# Patient Record
Sex: Male | Born: 1965 | Race: White | Hispanic: No | Marital: Married | Smoking: Former smoker
Health system: Southern US, Community
[De-identification: ages and names within clinical notes are randomized; demographics above are authoritative.]

## PROBLEM LIST (undated history)

## (undated) DIAGNOSIS — R413 Other amnesia: Secondary | ICD-10-CM

## (undated) DIAGNOSIS — M549 Dorsalgia, unspecified: Secondary | ICD-10-CM

## (undated) DIAGNOSIS — N200 Calculus of kidney: Secondary | ICD-10-CM

## (undated) DIAGNOSIS — I10 Essential (primary) hypertension: Secondary | ICD-10-CM

## (undated) DIAGNOSIS — E785 Hyperlipidemia, unspecified: Secondary | ICD-10-CM

## (undated) HISTORY — DX: Other amnesia: R41.3

## (undated) HISTORY — DX: Essential (primary) hypertension: I10

## (undated) HISTORY — PX: SHOULDER SURGERY: SHX246

## (undated) HISTORY — DX: Dorsalgia, unspecified: M54.9

## (undated) HISTORY — DX: Hyperlipidemia, unspecified: E78.5

## (undated) HISTORY — DX: Calculus of kidney: N20.0

---

## 1996-12-21 HISTORY — PX: OTHER SURGICAL HISTORY: SHX169

## 2004-02-05 ENCOUNTER — Ambulatory Visit (HOSPITAL_BASED_OUTPATIENT_CLINIC_OR_DEPARTMENT_OTHER): Admission: RE | Admit: 2004-02-05 | Discharge: 2004-02-05 | Payer: Self-pay | Admitting: Family Medicine

## 2004-02-05 ENCOUNTER — Encounter (INDEPENDENT_AMBULATORY_CARE_PROVIDER_SITE_OTHER): Payer: Self-pay | Admitting: Internal Medicine

## 2004-07-02 ENCOUNTER — Emergency Department (HOSPITAL_COMMUNITY): Admission: EM | Admit: 2004-07-02 | Discharge: 2004-07-02 | Payer: Self-pay | Admitting: Emergency Medicine

## 2007-12-05 ENCOUNTER — Emergency Department (HOSPITAL_COMMUNITY): Admission: EM | Admit: 2007-12-05 | Discharge: 2007-12-05 | Payer: Self-pay | Admitting: Emergency Medicine

## 2008-01-12 ENCOUNTER — Ambulatory Visit: Payer: Self-pay | Admitting: Family Medicine

## 2008-01-12 ENCOUNTER — Encounter (INDEPENDENT_AMBULATORY_CARE_PROVIDER_SITE_OTHER): Payer: Self-pay | Admitting: Internal Medicine

## 2008-01-12 DIAGNOSIS — K209 Esophagitis, unspecified without bleeding: Secondary | ICD-10-CM | POA: Insufficient documentation

## 2008-01-12 DIAGNOSIS — E669 Obesity, unspecified: Secondary | ICD-10-CM

## 2008-01-12 DIAGNOSIS — R519 Headache, unspecified: Secondary | ICD-10-CM | POA: Insufficient documentation

## 2008-01-12 DIAGNOSIS — I10 Essential (primary) hypertension: Secondary | ICD-10-CM | POA: Insufficient documentation

## 2008-01-12 DIAGNOSIS — R51 Headache: Secondary | ICD-10-CM

## 2008-01-18 ENCOUNTER — Ambulatory Visit: Payer: Self-pay | Admitting: Internal Medicine

## 2008-01-19 ENCOUNTER — Ambulatory Visit: Payer: Self-pay | Admitting: Internal Medicine

## 2008-01-19 ENCOUNTER — Telehealth (INDEPENDENT_AMBULATORY_CARE_PROVIDER_SITE_OTHER): Payer: Self-pay | Admitting: Internal Medicine

## 2008-01-19 DIAGNOSIS — E782 Mixed hyperlipidemia: Secondary | ICD-10-CM

## 2008-01-19 DIAGNOSIS — E1165 Type 2 diabetes mellitus with hyperglycemia: Secondary | ICD-10-CM

## 2008-01-19 LAB — CONVERTED CEMR LAB
ALT: 29 units/L (ref 0–53)
AST: 21 units/L (ref 0–37)
Albumin: 4.3 g/dL (ref 3.5–5.2)
BUN: 13 mg/dL (ref 6–23)
Basophils Relative: 1 % (ref 0.0–1.0)
Bilirubin, Direct: 0.1 mg/dL (ref 0.0–0.3)
Creatinine, Ser: 1.1 mg/dL (ref 0.4–1.5)
Direct LDL: 158.5 mg/dL
Eosinophils Relative: 8.1 % — ABNORMAL HIGH (ref 0.0–5.0)
GFR calc Af Amer: 94 mL/min
HDL: 30.9 mg/dL — ABNORMAL LOW (ref >39.0)
Hemoglobin: 15.3 g/dL (ref 13.0–17.0)
Monocytes Relative: 9.9 % (ref 3.0–11.0)
Neutro Abs: 3.2 10*3/uL (ref 1.4–7.7)
Platelets: 306 10*3/uL (ref 150–400)
RDW: 12.1 % (ref 11.5–14.6)
TSH: 1.56 microintl units/mL (ref 0.35–5.50)
Total CHOL/HDL Ratio: 7.4
Total Protein: 7.1 g/dL (ref 6.0–8.3)
Triglycerides: 226 mg/dL (ref 0–149)

## 2008-01-24 LAB — CONVERTED CEMR LAB: Hgb A1c MFr Bld: 6.8 % — ABNORMAL HIGH (ref 4.6–6.0)

## 2008-01-26 ENCOUNTER — Ambulatory Visit: Payer: Self-pay | Admitting: Family Medicine

## 2008-01-30 ENCOUNTER — Encounter (INDEPENDENT_AMBULATORY_CARE_PROVIDER_SITE_OTHER): Payer: Self-pay | Admitting: Internal Medicine

## 2008-02-17 ENCOUNTER — Encounter: Payer: Self-pay | Admitting: Internal Medicine

## 2008-02-23 ENCOUNTER — Ambulatory Visit: Payer: Self-pay | Admitting: Family Medicine

## 2008-02-27 LAB — CONVERTED CEMR LAB
ALT: 27 units/L (ref 0–53)
AST: 20 units/L (ref 0–37)
HDL: 38.1 mg/dL — ABNORMAL LOW (ref >39.0)
Triglycerides: 272 mg/dL (ref 0–149)

## 2008-02-28 ENCOUNTER — Ambulatory Visit: Payer: Self-pay | Admitting: Family Medicine

## 2008-02-28 DIAGNOSIS — R413 Other amnesia: Secondary | ICD-10-CM | POA: Insufficient documentation

## 2008-02-28 DIAGNOSIS — E119 Type 2 diabetes mellitus without complications: Secondary | ICD-10-CM

## 2008-02-28 DIAGNOSIS — M5126 Other intervertebral disc displacement, lumbar region: Secondary | ICD-10-CM

## 2008-03-02 LAB — CONVERTED CEMR LAB: ALT: 31 units/L (ref 0–53)

## 2008-03-12 ENCOUNTER — Encounter (INDEPENDENT_AMBULATORY_CARE_PROVIDER_SITE_OTHER): Payer: Self-pay | Admitting: Internal Medicine

## 2008-03-21 ENCOUNTER — Ambulatory Visit: Payer: Self-pay | Admitting: Family Medicine

## 2008-03-21 ENCOUNTER — Telehealth: Payer: Self-pay | Admitting: Family Medicine

## 2008-03-23 ENCOUNTER — Ambulatory Visit: Payer: Self-pay | Admitting: Family Medicine

## 2008-03-26 LAB — CONVERTED CEMR LAB
ALT: 29 units/L (ref 0–53)
Alkaline Phosphatase: 51 units/L (ref 39–117)
HDL: 33.2 mg/dL — ABNORMAL LOW (ref >39.0)
Total Bilirubin: 0.8 mg/dL (ref 0.3–1.2)
Total Protein: 7.3 g/dL (ref 6.0–8.3)
Triglycerides: 186 mg/dL — ABNORMAL HIGH (ref 0–149)

## 2008-04-09 ENCOUNTER — Ambulatory Visit: Payer: Self-pay | Admitting: Family Medicine

## 2008-04-09 ENCOUNTER — Encounter (INDEPENDENT_AMBULATORY_CARE_PROVIDER_SITE_OTHER): Payer: Self-pay | Admitting: Internal Medicine

## 2008-04-09 DIAGNOSIS — G4730 Sleep apnea, unspecified: Secondary | ICD-10-CM | POA: Insufficient documentation

## 2008-04-18 ENCOUNTER — Telehealth (INDEPENDENT_AMBULATORY_CARE_PROVIDER_SITE_OTHER): Payer: Self-pay | Admitting: Internal Medicine

## 2008-04-19 ENCOUNTER — Encounter (INDEPENDENT_AMBULATORY_CARE_PROVIDER_SITE_OTHER): Payer: Self-pay | Admitting: Internal Medicine

## 2008-04-23 ENCOUNTER — Ambulatory Visit (HOSPITAL_BASED_OUTPATIENT_CLINIC_OR_DEPARTMENT_OTHER): Admission: RE | Admit: 2008-04-23 | Discharge: 2008-04-23 | Payer: Self-pay | Admitting: Urology

## 2008-07-10 ENCOUNTER — Telehealth (INDEPENDENT_AMBULATORY_CARE_PROVIDER_SITE_OTHER): Payer: Self-pay | Admitting: Internal Medicine

## 2008-07-23 ENCOUNTER — Encounter: Admission: RE | Admit: 2008-07-23 | Discharge: 2008-07-23 | Payer: Self-pay | Admitting: Specialist

## 2008-07-24 ENCOUNTER — Telehealth (INDEPENDENT_AMBULATORY_CARE_PROVIDER_SITE_OTHER): Payer: Self-pay | Admitting: Internal Medicine

## 2008-07-27 ENCOUNTER — Encounter: Admission: RE | Admit: 2008-07-27 | Discharge: 2008-07-27 | Payer: Self-pay | Admitting: Specialist

## 2008-08-10 ENCOUNTER — Encounter (INDEPENDENT_AMBULATORY_CARE_PROVIDER_SITE_OTHER): Payer: Self-pay | Admitting: Internal Medicine

## 2008-08-10 ENCOUNTER — Telehealth (INDEPENDENT_AMBULATORY_CARE_PROVIDER_SITE_OTHER): Payer: Self-pay | Admitting: Internal Medicine

## 2008-08-28 ENCOUNTER — Telehealth (INDEPENDENT_AMBULATORY_CARE_PROVIDER_SITE_OTHER): Payer: Self-pay | Admitting: Internal Medicine

## 2008-09-12 ENCOUNTER — Telehealth (INDEPENDENT_AMBULATORY_CARE_PROVIDER_SITE_OTHER): Payer: Self-pay | Admitting: Internal Medicine

## 2008-09-25 ENCOUNTER — Ambulatory Visit: Payer: Self-pay | Admitting: Family Medicine

## 2008-10-05 ENCOUNTER — Ambulatory Visit: Payer: Self-pay | Admitting: Family Medicine

## 2008-10-09 ENCOUNTER — Telehealth (INDEPENDENT_AMBULATORY_CARE_PROVIDER_SITE_OTHER): Payer: Self-pay | Admitting: Internal Medicine

## 2008-10-09 LAB — CONVERTED CEMR LAB
ALT: 46 units/L (ref 0–53)
AST: 21 units/L (ref 0–37)
HDL: 51.8 mg/dL (ref >39.0)
Hgb A1c MFr Bld: 6.4 % — ABNORMAL HIGH (ref 4.6–6.0)
Triglycerides: 152 mg/dL — ABNORMAL HIGH (ref 0–149)

## 2008-10-12 ENCOUNTER — Telehealth (INDEPENDENT_AMBULATORY_CARE_PROVIDER_SITE_OTHER): Payer: Self-pay | Admitting: Internal Medicine

## 2008-10-24 ENCOUNTER — Ambulatory Visit (HOSPITAL_COMMUNITY): Admission: RE | Admit: 2008-10-24 | Discharge: 2008-10-24 | Payer: Self-pay | Admitting: Specialist

## 2008-11-06 ENCOUNTER — Telehealth (INDEPENDENT_AMBULATORY_CARE_PROVIDER_SITE_OTHER): Payer: Self-pay | Admitting: Internal Medicine

## 2008-11-08 ENCOUNTER — Ambulatory Visit: Payer: Self-pay | Admitting: Family Medicine

## 2008-11-08 DIAGNOSIS — R21 Rash and other nonspecific skin eruption: Secondary | ICD-10-CM | POA: Insufficient documentation

## 2008-11-09 ENCOUNTER — Ambulatory Visit: Payer: Self-pay | Admitting: Family Medicine

## 2008-11-20 ENCOUNTER — Telehealth (INDEPENDENT_AMBULATORY_CARE_PROVIDER_SITE_OTHER): Payer: Self-pay | Admitting: *Deleted

## 2008-11-20 LAB — CONVERTED CEMR LAB
Cholesterol: 189 mg/dL (ref 0–200)
HDL: 44.7 mg/dL (ref >39.0)
VLDL: 43 mg/dL — ABNORMAL HIGH (ref 0–40)

## 2008-12-12 ENCOUNTER — Telehealth (INDEPENDENT_AMBULATORY_CARE_PROVIDER_SITE_OTHER): Payer: Self-pay | Admitting: *Deleted

## 2009-01-04 ENCOUNTER — Encounter (INDEPENDENT_AMBULATORY_CARE_PROVIDER_SITE_OTHER): Payer: Self-pay | Admitting: Internal Medicine

## 2009-01-25 ENCOUNTER — Encounter (INDEPENDENT_AMBULATORY_CARE_PROVIDER_SITE_OTHER): Payer: Self-pay | Admitting: Internal Medicine

## 2009-05-09 ENCOUNTER — Encounter (INDEPENDENT_AMBULATORY_CARE_PROVIDER_SITE_OTHER): Payer: Self-pay | Admitting: *Deleted
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
## 2010-01-28 ENCOUNTER — Telehealth: Payer: Self-pay | Admitting: Internal Medicine

## 2010-05-22 ENCOUNTER — Telehealth: Payer: Self-pay | Admitting: Family Medicine

## 2010-05-22 DIAGNOSIS — T7840XA Allergy, unspecified, initial encounter: Secondary | ICD-10-CM | POA: Insufficient documentation

## 2010-05-26 ENCOUNTER — Telehealth: Payer: Self-pay | Admitting: Family Medicine

## 2010-05-27 ENCOUNTER — Encounter: Payer: Self-pay | Admitting: Family Medicine

## 2010-06-24 ENCOUNTER — Ambulatory Visit: Payer: Self-pay | Admitting: Family Medicine

## 2010-06-25 ENCOUNTER — Telehealth: Payer: Self-pay | Admitting: Family Medicine

## 2010-06-26 ENCOUNTER — Encounter: Payer: Self-pay | Admitting: Family Medicine

## 2010-06-26 LAB — CONVERTED CEMR LAB
ALT: 38 units/L (ref 0–53)
Alkaline Phosphatase: 54 units/L (ref 39–117)
Bilirubin, Direct: 0.1 mg/dL (ref 0.0–0.3)
Chloride: 105 meq/L (ref 96–112)
Cholesterol: 222 mg/dL — ABNORMAL HIGH (ref 0–200)
Creatinine, Ser: 0.8 mg/dL (ref 0.4–1.5)
Direct LDL: 123.7 mg/dL
GFR calc non Af Amer: 109.07 mL/min (ref >60)
Glucose, Bld: 120 mg/dL — ABNORMAL HIGH (ref 70–99)
Total Bilirubin: 0.5 mg/dL (ref 0.3–1.2)
Total Protein: 7.2 g/dL (ref 6.0–8.3)

## 2010-06-30 ENCOUNTER — Encounter: Payer: Self-pay | Admitting: Family Medicine

## 2010-07-25 ENCOUNTER — Ambulatory Visit: Payer: Self-pay | Admitting: Family Medicine

## 2010-07-25 ENCOUNTER — Encounter: Payer: Self-pay | Admitting: Family Medicine

## 2010-07-28 LAB — CONVERTED CEMR LAB
ALT: 39 units/L (ref 0–53)
Alkaline Phosphatase: 57 units/L (ref 39–117)
Cholesterol: 201 mg/dL — ABNORMAL HIGH (ref 0–200)
Total Bilirubin: 0.7 mg/dL (ref 0.3–1.2)
VLDL: 68.6 mg/dL — ABNORMAL HIGH (ref 0.0–40.0)

## 2010-07-29 ENCOUNTER — Telehealth: Payer: Self-pay | Admitting: Family Medicine

## 2010-08-29 ENCOUNTER — Telehealth: Payer: Self-pay | Admitting: Family Medicine

## 2010-10-01 ENCOUNTER — Telehealth: Payer: Self-pay | Admitting: Family Medicine

## 2010-10-06 ENCOUNTER — Telehealth: Payer: Self-pay | Admitting: Family Medicine

## 2010-10-22 ENCOUNTER — Telehealth: Payer: Self-pay | Admitting: Family Medicine

## 2010-11-06 ENCOUNTER — Telehealth: Payer: Self-pay | Admitting: Family Medicine

## 2010-12-17 ENCOUNTER — Telehealth: Payer: Self-pay | Admitting: Family Medicine

## 2011-01-20 NOTE — Progress Notes (Signed)
Summary: regarding prior auth for singulair  Phone Note From Pharmacy   Caller: [facility] Summary of Call: Prior Berkley Harvey is needed on singulair.  I tried to get this approved in june but insurance said that pt would need to try a nasal spray first, which he has not tried.  Insurance will not approve this until that is tried and failed.  Please advise. Letter from insurance is on your desk. Initial call taken by: Lowella Petties CMA,  June 25, 2010 12:00 PM  Follow-up for Phone Call        patient stated that he had tried nasal steroid previously w/o relief.  Also has tried 3 oral agents w/o relief.  I wrote on the form.  Please fax back along with clinic note.  Follow-up by: Crawford Givens MD,  June 25, 2010 1:36 PM  Additional Follow-up for Phone Call Additional follow up Details #1::        Form and OV note faxed.  Lugene Fuquay CMA Jessicia Napolitano Dull)  June 25, 2010 2:41 PM
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
## 2011-01-20 NOTE — Progress Notes (Signed)
Summary: needs refills on meds  Phone Note Refill Request Call back at Home Phone [phone] Message from:  Patient  Refills Requested: Medication #1:  VICODIN 5-500 MG TABS 1 by mouth three times a day as needed for pain with sedation caution  Medication #2:  METHOCARBAMOL 500 MG TABS 1 by mouth three times a day as needed pain/muscle spasm with sedation caution Phoned request from pt, please send to [facility].  Pt was told to call if he needed more meds.  He is asking if he can have a higher dose on vicodin, had taken 7.5 mg's before  Initial call taken by: Lowella Petties CMA,  July 29, 2010 11:28 AM  Follow-up for Phone Call        signed, please have patient notify clinic if pain isn't better controlled.  Follow-up by: Crawford Givens MD,  July 29, 2010 1:54 PM  Additional Follow-up for Phone Call Additional follow up Details #1::        Rx's faxed.  Patient Advised.  Additional Follow-up by: Delilah Shan CMA (AAMA),  July 29, 2010 2:28 PM    New/Updated Medications: LORTAB 7.5-500 MG TABS (HYDROCODONE-ACETAMINOPHEN) 1 by mouth three times a day as needed pain Prescriptions: METHOCARBAMOL 500 MG TABS (METHOCARBAMOL) 1 by mouth three times a day as needed pain/muscle spasm with sedation caution  #90 x 1   Entered and Authorized by:   Crawford Givens MD   Signed by:   Crawford Givens MD on 07/29/2010   Method used:   Printed then faxed to ...       [facility] (retail)       [address]       [place], [place]  [address]       Ph: [phone]       Fax: [phone]   RxID:   [id number] LORTAB 7.5-500 MG TABS (HYDROCODONE-ACETAMINOPHEN) 1 by mouth three times a day as needed pain  #90 x 1   Entered and Authorized by:   Crawford Givens MD   Signed by:   Crawford Givens MD on 07/29/2010   Method used:   Printed then faxed to ...       [facility] (retail)       [address]       [place], [place]  [address]       Ph: [phone]       Fax: [phone]   RxID:   [id number]
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
## 2011-01-20 NOTE — Progress Notes (Signed)
Summary: prior auth needed for singulair  Phone Note From Pharmacy   Caller: [facility]  [phone]/ [facility] Summary of Call: Prior Berkley Harvey is needed for singulair, form is on your shelf- [name]'s pt, he has appt with Dr. Para March in july. Initial call taken by: Lowella Petties CMA,  May 22, 2010 9:25 AM  Follow-up for Phone Call        signed. Follow-up by: Shaune Leeks MD,  May 22, 2010 1:45 PM  Additional Follow-up for Phone Call Additional follow up Details #1::        Form faxed. Additional Follow-up by: Lowella Petties CMA,  May 22, 2010 2:05 PM     Appended Document: prior auth needed for singulair    Clinical Lists Changes  Problems: Added new problem of ALLERGY, ENVIRONMENTAL (ICD-995.3)

## 2011-01-20 NOTE — Letter (Signed)
Summary: Mini-Mental State Examination  Mini-Mental State Examination   Imported By: Maryln Gottron 08/01/2010 12:55:32  _____________________________________________________________________  External Attachment:    Type:   Image     Comment:   External Document

## 2011-01-20 NOTE — Medication Information (Signed)
Summary: Prior Authorization & Approval for Singulair/[facility]  Prior Authorization & Approval for Singulair/[facility]   Imported By: Lanelle Bal 07/08/2010 10:57:12  _____________________________________________________________________  External Attachment:    Type:   Image     Comment:   External Document
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
## 2011-01-20 NOTE — Miscellaneous (Signed)
  Clinical Lists Changes  Medications: Added new medication of SIMVASTATIN 20 MG TABS (SIMVASTATIN) Take 1 tab by mouth at bedtime - Signed Rx of SIMVASTATIN 20 MG TABS (SIMVASTATIN) Take 1 tab by mouth at bedtime;  #90 x 3;  Signed;  Entered by: Delilah Shan CMA (AAMA);  Authorized by: Crawford Givens MD;  Method used: Electronically to [facility] [address], [place], [place], [place]  [address], Ph: [phone], Fax: [phone]    Prescriptions: SIMVASTATIN 20 MG TABS (SIMVASTATIN) Take 1 tab by mouth at bedtime  #90 x 3   Entered by:   Delilah Shan CMA (AAMA)   Authorized by:   Crawford Givens MD   Signed by:   Delilah Shan CMA (AAMA) on 06/26/2010   Method used:   Electronically to        [facility] (retail)       [address]       [place], [place]  [address]       Ph: [phone]       Fax: [phone]   RxID:   [id number]
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
## 2011-01-20 NOTE — Progress Notes (Signed)
Summary: refill request for lortab  Phone Note Refill Request Message from:  Fax from Pharmacy  Refills Requested: Medication #1:  LORTAB 7.5-500 MG TABS 1 by mouth three times a day as needed pain.   Last Refilled: 09/20/2010 [facility] [phone]  Initial call taken by: Lowella Petties CMA, AAMA,  October 22, 2010 4:08 PM  Follow-up for Phone Call        please call in.  thanks.  Follow-up by: Crawford Givens MD,  October 22, 2010 4:54 PM  Additional Follow-up for Phone Call Additional follow up Details #1::        Rx called to pharmacy Additional Follow-up by: Sydell Axon LPN,  October 22, 2010 5:00 PM    Prescriptions: LORTAB 7.5-500 MG TABS (HYDROCODONE-ACETAMINOPHEN) 1 by mouth three times a day as needed pain  #90 x 1   Entered and Authorized by:   Crawford Givens MD   Signed by:   Crawford Givens MD on 10/22/2010   Method used:   Telephoned to ...       [facility] (retail)       [address]       [place], [place]  [address]       Ph: [phone]       Fax: [phone]   RxID:   [id number]
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
## 2011-01-20 NOTE — Progress Notes (Signed)
Summary: ok to take tylenol PM?  Phone Note Call from Patient Call back at Home Phone [phone]   Caller: Patient Summary of Call: Pt has back, hip, neck and shoulder pain and is having to take 2 tylenol pm every night. He has been taking this for quite a while- about a year and a half.   He is asking if he should try taking something else, or is it ok for him to continue with the tylenol pm, since it does help.  He takes this along with his muscle relaxer and lortab. Initial call taken by: Lowella Petties CMA,  October 06, 2010 12:14 PM  Follow-up for Phone Call        It is okay to take as long as he doesn't take more than 4000mg  of tylenol in a day.  he needs to add up the total dose for all of the meds.  If it's more than 4000 mg consistently, then we need to decrease his total tylenol use.  (He can take 6 vicodin a day and still take 2 tylenol PMs) Follow-up by: Crawford Givens MD,  October 06, 2010 1:17 PM  Additional Follow-up for Phone Call Additional follow up Details #1::        Left message on machine for patient to call back. Sydell Axon LPN  October 06, 2010 2:14 PM  Patient notified as instructed by telephone. Additional Follow-up by: Sydell Axon LPN,  October 06, 2010 3:52 PM

## 2011-01-20 NOTE — Progress Notes (Signed)
Summary: needs DOT forms completed  Phone Note Call from Patient Call back at Home Phone [phone]   Caller: Patient Call For: Crawford Givens MD Summary of Call: Pt needs DOT forms completed, he had a physical in july but will still need  hearing, vision and urine checked.  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  Please advise. Initial call taken by: Lowella Petties CMA, AAMA,  November 06, 2010 11:01 AM  Follow-up for Phone Call        please get him a 30 min appointment so I can do the work for the form.  With his previous healthy problems, I would prefer that.  thanks.  Follow-up by: Crawford Givens MD,  November 06, 2010 1:09 PM  Additional Follow-up for Phone Call Additional follow up Details #1::        Patient Advised.   He says he will call back in to schedule appointment when he can look at his calendar.  I advised him to be sure to ask for a 30 minute appointment and to bring his DOT paperwork with him.  Lugene Fuquay CMA Duncan Dull)  November 06, 2010 2:25 PM

## 2011-01-22 NOTE — Progress Notes (Signed)
Summary: refill requests for lortab, robaxin  Phone Note Refill Request Message from:  Pharmacy  Refills Requested: Medication #1:  LORTAB 7.5-500 MG TABS 1 by mouth three times a day as needed pain.   Last Refilled: 11/22/2010  Medication #2:  METHOCARBAMOL 500 MG TABS 1 by mouth three times a day as needed pain/muscle spasm with sedation caution   Last Refilled: 11/22/2010 [facility], [phone], ask for [employer].  Initial call taken by: Lowella Petties CMA, AAMA,  December 17, 2010 12:23 PM  Follow-up for Phone Call        please call in.   sedation caution on both meds.   Follow-up by: Crawford Givens MD,  December 17, 2010 12:56 PM  Additional Follow-up for Phone Call Additional follow up Details #1::        [facility]. Additional Follow-up by: Lowella Petties CMA, AAMA,  December 17, 2010 2:54 PM    Prescriptions: LORTAB 7.5-500 MG TABS (HYDROCODONE-ACETAMINOPHEN) 1 by mouth three times a day as needed pain  #90 x 1   Entered and Authorized by:   Crawford Givens MD   Signed by:   Crawford Givens MD on 12/17/2010   Method used:   Telephoned to ...       [facility] (retail)       [address]       [place], [place]  [address]       Ph: [phone]       Fax: [phone]   RxID:   [id number] METHOCARBAMOL 500 MG TABS (METHOCARBAMOL) 1 by mouth three times a day as needed pain/muscle spasm with sedation caution  #90 x 1   Entered and Authorized by:   Crawford Givens MD   Signed by:   Crawford Givens MD on 12/17/2010   Method used:   Telephoned to ...       [facility] (retail)       [address]       [place], [place]  [address]       Ph: [phone]       Fax: [phone]   RxID:   [id number]

## 2011-01-22 NOTE — Progress Notes (Signed)
Summary: copy of med list given to pt  Phone Note Call from Patient   Caller: Patient Summary of Call: [place] and  requests copy of medication list, copy given to him.              Lowella Petties CMA, AAMA  December 17, 2010 12:05 PM   Follow-up for Phone Call        noted. Crawford Givens MD  December 17, 2010 12:05 PM

## 2011-01-28 ENCOUNTER — Encounter (INDEPENDENT_AMBULATORY_CARE_PROVIDER_SITE_OTHER): Payer: Self-pay | Admitting: *Deleted

## 2011-01-28 ENCOUNTER — Other Ambulatory Visit: Payer: Self-pay | Admitting: Family Medicine

## 2011-01-28 ENCOUNTER — Other Ambulatory Visit (INDEPENDENT_AMBULATORY_CARE_PROVIDER_SITE_OTHER): Payer: PRIVATE HEALTH INSURANCE

## 2011-01-28 DIAGNOSIS — E782 Mixed hyperlipidemia: Secondary | ICD-10-CM

## 2011-01-28 DIAGNOSIS — E1165 Type 2 diabetes mellitus with hyperglycemia: Secondary | ICD-10-CM

## 2011-01-28 DIAGNOSIS — E785 Hyperlipidemia, unspecified: Secondary | ICD-10-CM

## 2011-01-28 LAB — LIPID PANEL
Cholesterol: 199 mg/dL (ref 0–200)
HDL: 38.2 mg/dL — ABNORMAL LOW (ref >39.00)
Total CHOL/HDL Ratio: 5
Triglycerides: 401 mg/dL — ABNORMAL HIGH (ref 0.0–149.0)
VLDL: 80.2 mg/dL — ABNORMAL HIGH (ref 0.0–40.0)

## 2011-01-30 ENCOUNTER — Encounter: Payer: Self-pay | Admitting: Family Medicine

## 2011-01-30 ENCOUNTER — Ambulatory Visit (INDEPENDENT_AMBULATORY_CARE_PROVIDER_SITE_OTHER): Payer: PRIVATE HEALTH INSURANCE | Admitting: Family Medicine

## 2011-01-30 DIAGNOSIS — E782 Mixed hyperlipidemia: Secondary | ICD-10-CM

## 2011-01-30 DIAGNOSIS — R413 Other amnesia: Secondary | ICD-10-CM

## 2011-01-30 DIAGNOSIS — M5126 Other intervertebral disc displacement, lumbar region: Secondary | ICD-10-CM
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
## 2011-02-13 ENCOUNTER — Telehealth: Payer: Self-pay | Admitting: Family Medicine

## 2011-02-17 NOTE — Progress Notes (Signed)
Summary: Methocarbamol and Lortab  Phone Note Refill Request   Refills Requested: Medication #1:  METHOCARBAMOL 500 MG TABS 1 by mouth three times a day as needed pain/muscle spasm with sedation caution  Medication #2:  LORTAB 7.5-500 MG TABS 1 by mouth three times a day as needed pain Kenard Gower at [facility]   Method Requested: Telephone to Pharmacy Initial call taken by: Delilah Shan CMA Duncan Dull),  February 13, 2011 11:49 AM  Follow-up for Phone Call        please call in.  Follow-up by: Crawford Givens MD,  February 13, 2011 1:54 PM  Additional Follow-up for Phone Call Additional follow up Details #1::        Medication phoned to pharmacy.  Additional Follow-up by: Delilah Shan CMA (AAMA),  February 13, 2011 2:14 PM    Prescriptions: LORTAB 7.5-500 MG TABS (HYDROCODONE-ACETAMINOPHEN) 1 by mouth three times a day as needed pain  #90 x 1   Entered and Authorized by:   Crawford Givens MD   Signed by:   Crawford Givens MD on 02/13/2011   Method used:   Telephoned to ...       [facility] (retail)       [address]       [place], [place]  [address]       Ph: [phone]       Fax: [phone]   RxID:   [id number] METHOCARBAMOL 500 MG TABS (METHOCARBAMOL) 1 by mouth three times a day as needed pain/muscle spasm with sedation caution  #90 x 1   Entered and Authorized by:   Crawford Givens MD   Signed by:   Crawford Givens MD on 02/13/2011   Method used:   Telephoned to ...       [facility] (retail)       [address]       [place], [place]  [address]       Ph: [phone]       Fax: [phone]   RxID:   [id number]

## 2011-03-30 ENCOUNTER — Other Ambulatory Visit: Payer: Self-pay | Admitting: *Deleted

## 2011-03-30 MED ORDER — GABAPENTIN 300 MG PO CAPS: 300.0000 mg | ORAL_CAPSULE | Freq: Three times a day (TID) | ORAL | Status: DC

## 2011-04-09 ENCOUNTER — Other Ambulatory Visit: Payer: Self-pay | Admitting: *Deleted

## 2011-04-09 MED ORDER — HYDROCODONE-ACETAMINOPHEN 7.5-500 MG PO TABS: ORAL_TABLET | ORAL | Status: DC

## 2011-04-09 NOTE — Telephone Encounter (Signed)
Please call in

## 2011-04-10 ENCOUNTER — Other Ambulatory Visit: Payer: Self-pay | Admitting: *Deleted

## 2011-04-10 MED ORDER — METHOCARBAMOL 500 MG PO TABS: 500.0000 mg | ORAL_TABLET | Freq: Three times a day (TID) | ORAL | Status: AC | PRN

## 2011-04-10 NOTE — Telephone Encounter (Signed)
Medication phoned to pharmacy.  

## 2011-04-10 NOTE — Telephone Encounter (Signed)
Handled on another note.

## 2011-04-21 ENCOUNTER — Telehealth: Payer: Self-pay | Admitting: *Deleted

## 2011-04-21 DIAGNOSIS — G4730 Sleep apnea, unspecified: Secondary | ICD-10-CM

## 2011-04-21 NOTE — Telephone Encounter (Signed)
Referral done. Please notify patient.

## 2011-04-21 NOTE — Telephone Encounter (Signed)
Patient is asking if he can get referral for sleep study. He says that his cpap is not working for him. Please advise.

## 2011-04-21 NOTE — Telephone Encounter (Signed)
Patient advised.

## 2011-05-05 NOTE — Op Note (Signed)
Patrick Ochoa                ACCOUNT NO.:  [id number]   MEDICAL RECORD NO.:  [id number]          PATIENT TYPE:  AMB   LOCATION:  DAY                          FACILITY:  [facility]   PHYSICIAN:  Jene Every, M.D.    DATE OF BIRTH:  1966/03/08   DATE OF PROCEDURE:  10/24/2008  DATE OF DISCHARGE:                               OPERATIVE REPORT   PREOPERATIVE DIAGNOSIS:  Impingement syndrome left shoulder.   POSTOPERATIVE DIAGNOSIS:  Impingement syndrome left shoulder, glenoid  labral tear.   PROCEDURE PERFORMED:  1. Left shoulder arthroscopy.  2. Debridement of the anterior labral tear.  3. Subacromial decompression bursectomy.  4. Debridement of rotator cuff.   SURGEON:  Jene Every, M.D.   ASSISTANT:  Roma Schanz, P.A.   BRIEF HISTORY AND INDICATIONS:  This is a 45 year old with refractory  shoulder pain and MRI indicating impingement tendinitis was indicated  for a decompression, bursectomy, partial relief from cervical cortical  steroid injections.  Risks and benefits were discussed including  bleeding, infection, no change in symptoms, worsening symptoms, need for  repeat debridement, DVT, PE and anesthetic complications, etc.   TECHNIQUE:  With the patient in the supine beach-chair position after  induction of adequate general anesthesia and 2 grams Kefzol, left  shoulder and upper extremity was prepped and draped in the usual sterile  fashion.  Exam under anesthesia revealed full range of motion.   Surgical marker was utilized to delineate the acromion, coracoid and the  [facility] joint.  Standard posterolateral portal was utilized with an incision  through the skin only and anterolateral portal was incised as well  through the skin only and an anterior portal as well.  With the arm in  the 70/30 position we advanced the arthroscopic cannula into the  glenohumeral space penetrating the capsule atraumatically.  We were in  line with the coracoid.  We placed an  anterior cannula, penetrating the  capsule beneath the biceps under direct visualization.   Noted was anterior tearing of the labrum.  The shaver was introduced and  I utilized the shaver and debrided the anterior labrum to a stable base.  There was no evidence of rotator cuff tear.  Subscap was unremarkable.  Biceps tendon was unremarkable.  The glenoid and the humeral head was  unremarkable as well.  After the labral tear was debrided to a stable  base we redirected the arthroscopic camera in the subacromial space and  a lateral portal with the cannula triangulating noting exuberant  hypertrophic bursa/synovitis and shaver was introduced and utilized to  perform a bursectomy/synovectomy.  There was small fraying of the  rotator cuff.  This was shaved.  There was very minimal at less than 5%  of the tendon.  Good bleeding tissue was noted.   We used the ArthroWand and released and morselized the CA ligament.  Good space in the subacromial space was provided.  There was no  significant impingement from the [facility] joint with the acromial anatomy at  this point in full.  Inspection of the rotator cuff revealed no evidence  of a tear.  It was probed and evaluated.  I therefore removed all  arthroscopic equipment and portals were closed with 4-0 simple suture,  0.25% Marcaine with epinephrine was infiltrated in the joint.  The wound  was dressed sterilely and awakened without difficulty and transported to  recovery room in satisfactory condition.  The patient tolerated the  procedure with no complications.      Jene Every, M.D.  Electronically Signed     JB/MEDQ  D:  10/24/2008  T:  10/24/2008  Job:  [id number]

## 2011-05-05 NOTE — Op Note (Signed)
NAMEFERD, HORRIGAN                ACCOUNT NO.:  [id number]   MEDICAL RECORD NO.:  [id number]          PATIENT TYPE:  AMB   LOCATION:  [facility]                         FACILITY:  [facility]   PHYSICIAN:  Mark C. Vernie Ammons, M.D.  DATE OF BIRTH:  May 16, 1966   DATE OF PROCEDURE:  DATE OF DISCHARGE:                               OPERATIVE REPORT   PREOPERATIVE DIAGNOSIS:  Desires sterility.   POSTOPERATIVE DIAGNOSIS:  Desires sterility.   PROCEDURE:  Bilateral vasectomy.   SURGEON:  Mark C. Vernie Ammons, M.D.   ANESTHESIA:  General, with local supplement.   SPECIMENS:  None.   DRAINS:  None.   BLOOD LOSS:  Less than 1 mL.   COMPLICATIONS:  None.   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  An attempt at vasectomy was made as an  outpatient but was unsuccessful due to the patient's anatomy.  When I  examined his testicle, I agreed that he would be best served with a  vasectomy under general anesthetic, and the risks, complications,  alternatives, and limitations were discussed with the patient, and he  understands elected to proceed.   DESCRIPTION OF OPERATION:  After informed consent, the patient was taken  to the OR, was placed on the table, and administered general anesthesia.  His genitalia was sterilely prepped and draped and an official time-out  was then performed.  Using the modified vasectomy piercing hemostat, I  pierced the skin in the midline of the scrotum and was able to palpate  his vas on the right-hand side above his elevated right testicle.  The  vas clamp was then passed through the incision and the vas was easily  grasped and brought to the level of the incision.  I used the modified  sharpened hemostat in order to clear off perivasal tissue and exposed  the vas itself.  I then exposed a length of approximately 2 cm and tied  the vas proximally  with 2-0 silk suture.  Two proximal sutures were  applied and one distal suture and then the vas was divided between  these.  I used Bovie electrocautery to cauterize the loop, the very  luminal openings, and these ends were then allowed to drop back into the  right hemiscrotum.  The left side was then addressed and treated in  identical fashion.  I checked for bleeding and none was noted.  I then  used 0.5% plain Marcaine to infiltrate the area of the incision and  Neosporin, as well fluff and 4x4s were applied as a sterile dressing as  well as a scrotal support, and the patient was taken to recovery in  stable satisfactory condition.  He tolerated procedure well.  There were  no intraoperative complications.  He received 1 gram of Ancef preop and  will be given  a prescription for 16 Vicodin postoperatively.  I then have given him  postoperative instructions, including the fact that he is not consider  himself sterile until two  negative semen analyses have been seen by me  in the office.  Until then, he is to use his current contraceptive  methods.      Mark C. Vernie Ammons, M.D.  Electronically Signed     MCO/MEDQ  D:  04/23/2008  T:  04/23/2008  Job:  [id number]

## 2011-06-02 ENCOUNTER — Other Ambulatory Visit: Payer: Self-pay | Admitting: *Deleted

## 2011-06-03 ENCOUNTER — Telehealth: Payer: Self-pay | Admitting: *Deleted

## 2011-06-03 MED ORDER — HYDROCODONE-ACETAMINOPHEN 7.5-500 MG PO TABS: ORAL_TABLET | ORAL | Status: DC

## 2011-06-03 MED ORDER — METHOCARBAMOL 500 MG PO TABS: 500.0000 mg | ORAL_TABLET | Freq: Three times a day (TID) | ORAL | Status: DC

## 2011-06-03 NOTE — Telephone Encounter (Signed)
See rx note 

## 2011-06-03 NOTE — Telephone Encounter (Signed)
Hydrocodone called to pharmacy  

## 2011-06-03 NOTE — Telephone Encounter (Signed)
Rx refill request for Methocarbamol and Hydrocodone sent to Dr. Para March yesterday for approval.  Waiting on his response.

## 2011-06-03 NOTE — Telephone Encounter (Signed)
Please call in hydrocodone rx and notify pt.  Thanks.

## 2011-06-29 ENCOUNTER — Other Ambulatory Visit: Payer: Self-pay | Admitting: *Deleted

## 2011-06-29 MED ORDER — SIMVASTATIN 20 MG PO TABS: 20.0000 mg | ORAL_TABLET | Freq: Every day | ORAL | Status: DC

## 2011-06-29 MED ORDER — GABAPENTIN 300 MG PO CAPS: 300.0000 mg | ORAL_CAPSULE | Freq: Three times a day (TID) | ORAL | Status: DC

## 2011-06-29 MED ORDER — LISINOPRIL 10 MG PO TABS: 10.0000 mg | ORAL_TABLET | Freq: Every day | ORAL | Status: DC

## 2011-06-29 MED ORDER — METHOCARBAMOL 500 MG PO TABS: 500.0000 mg | ORAL_TABLET | Freq: Three times a day (TID) | ORAL | Status: DC

## 2011-06-29 NOTE — Telephone Encounter (Signed)
rx sent

## 2011-06-29 NOTE — Telephone Encounter (Signed)
Okay to refill Robaxin? Appointment scheduled for August.

## 2011-07-27 ENCOUNTER — Other Ambulatory Visit: Payer: Self-pay | Admitting: *Deleted

## 2011-07-27 ENCOUNTER — Other Ambulatory Visit (INDEPENDENT_AMBULATORY_CARE_PROVIDER_SITE_OTHER): Payer: PRIVATE HEALTH INSURANCE | Admitting: Family Medicine

## 2011-07-27 DIAGNOSIS — E7800 Pure hypercholesterolemia, unspecified: Secondary | ICD-10-CM

## 2011-07-27 DIAGNOSIS — I10 Essential (primary) hypertension: Secondary | ICD-10-CM

## 2011-07-27 DIAGNOSIS — E119 Type 2 diabetes mellitus without complications: Secondary | ICD-10-CM

## 2011-07-27 LAB — LIPID PANEL
Cholesterol: 201 mg/dL — ABNORMAL HIGH (ref 0–200)
Total CHOL/HDL Ratio: 5
Triglycerides: 418 mg/dL — ABNORMAL HIGH (ref 0.0–149.0)
VLDL: 83.6 mg/dL — ABNORMAL HIGH (ref 0.0–40.0)

## 2011-07-27 LAB — COMPREHENSIVE METABOLIC PANEL
ALT: 32 U/L (ref 0–53)
AST: 22 U/L (ref 0–37)
Albumin: 4.9 g/dL (ref 3.5–5.2)
Alkaline Phosphatase: 57 U/L (ref 39–117)
BUN: 18 mg/dL (ref 6–23)
Calcium: 9.5 mg/dL (ref 8.4–10.5)
Chloride: 99 mEq/L (ref 96–112)
Potassium: 4.3 mEq/L (ref 3.5–5.1)

## 2011-07-27 LAB — LDL CHOLESTEROL, DIRECT: Direct LDL: 100.3 mg/dL

## 2011-07-27 MED ORDER — HYDROCODONE-ACETAMINOPHEN 7.5-500 MG PO TABS: ORAL_TABLET | ORAL | Status: DC

## 2011-07-27 NOTE — Telephone Encounter (Signed)
Please call in

## 2011-07-27 NOTE — Telephone Encounter (Signed)
Rx called to pharmacy

## 2011-07-30 ENCOUNTER — Encounter: Payer: Self-pay | Admitting: Family Medicine

## 2011-07-30 ENCOUNTER — Other Ambulatory Visit: Payer: Self-pay | Admitting: *Deleted

## 2011-07-30 MED ORDER — MONTELUKAST SODIUM 10 MG PO TABS: 10.0000 mg | ORAL_TABLET | Freq: Every day | ORAL | Status: DC

## 2011-07-30 NOTE — Telephone Encounter (Signed)
Received faxed refill request, medication was not on med list please advise.

## 2011-07-30 NOTE — Telephone Encounter (Signed)
He had been on this prev.  Will continue.  Rx sent.

## 2011-07-31 ENCOUNTER — Encounter: Payer: Self-pay | Admitting: Family Medicine

## 2011-07-31 ENCOUNTER — Ambulatory Visit (INDEPENDENT_AMBULATORY_CARE_PROVIDER_SITE_OTHER): Payer: PRIVATE HEALTH INSURANCE | Admitting: Family Medicine

## 2011-07-31 VITALS — BP 132/90 | HR 75 | Temp 98.7°F | Wt 250.2 lb

## 2011-07-31 DIAGNOSIS — M549 Dorsalgia, unspecified: Secondary | ICD-10-CM

## 2011-07-31 DIAGNOSIS — R413 Other amnesia: Secondary | ICD-10-CM

## 2011-07-31 DIAGNOSIS — E119 Type 2 diabetes mellitus without complications: Secondary | ICD-10-CM

## 2011-07-31 DIAGNOSIS — R51 Headache: Secondary | ICD-10-CM

## 2011-07-31 DIAGNOSIS — G8929 Other chronic pain: Secondary | ICD-10-CM

## 2011-07-31 DIAGNOSIS — E782 Mixed hyperlipidemia: Secondary | ICD-10-CM

## 2011-07-31 DIAGNOSIS — J309 Allergic rhinitis, unspecified: Secondary | ICD-10-CM

## 2011-07-31 MED ORDER — GABAPENTIN 300 MG PO CAPS: 300.0000 mg | ORAL_CAPSULE | Freq: Three times a day (TID) | ORAL | Status: DC

## 2011-07-31 NOTE — Patient Instructions (Addendum)
Please talk to Patrick Ochoa about getting medication assistance on either treximet or sumatriptan.   Recheck labs in 6 months with a OV a few days after that. Gradually increase up to 6 pills a day on the neurontin.  Call me with an update early next week.
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
## 2011-08-03 DIAGNOSIS — M549 Dorsalgia, unspecified: Secondary | ICD-10-CM | POA: Insufficient documentation

## 2011-08-03 DIAGNOSIS — J309 Allergic rhinitis, unspecified: Secondary | ICD-10-CM | POA: Insufficient documentation

## 2011-08-03 NOTE — Assessment & Plan Note (Signed)
Will work on drug assistance program.

## 2011-08-03 NOTE — Assessment & Plan Note (Signed)
He can't exercise much with his back pain.  Will work on diet and we'll try to address back pain.

## 2011-08-03 NOTE — Assessment & Plan Note (Signed)
Continue current meds 

## 2011-08-03 NOTE — Assessment & Plan Note (Signed)
Continue current meds, work on diet.

## 2011-08-03 NOTE — Assessment & Plan Note (Signed)
Continue current meds but increase the gabapentin.  He'll call back with update.  No new focal changes in exam today.  He agrees with plan.

## 2011-08-06 ENCOUNTER — Telehealth: Payer: Self-pay | Admitting: *Deleted

## 2011-08-06 NOTE — Telephone Encounter (Signed)
[facility] uses [facility].

## 2011-08-06 NOTE — Telephone Encounter (Signed)
Patient called to let you know that that the Gabapentin isn't helping. She says that he feels like it isn't quite enough. Still having a lot of pain. Really hard for him to move around and do things. He is asking if it can be increased or either try something else.

## 2011-08-09 NOTE — Telephone Encounter (Signed)
Call pt and increase the gabapentin up to 900mg  at a time, 3 times a day.  Please update med list and have pt call back with update.  Thanks.

## 2011-08-10 NOTE — Telephone Encounter (Signed)
Patient advised.

## 2011-08-20 ENCOUNTER — Other Ambulatory Visit: Payer: Self-pay | Admitting: *Deleted

## 2011-08-20 MED ORDER — HYDROCODONE-ACETAMINOPHEN 7.5-500 MG PO TABS: ORAL_TABLET | ORAL | Status: DC

## 2011-08-20 NOTE — Telephone Encounter (Signed)
Rx. Phoned to pharmacy 

## 2011-08-20 NOTE — Telephone Encounter (Signed)
Please call in.  Thanks.   

## 2011-08-21 ENCOUNTER — Telehealth: Payer: Self-pay | Admitting: *Deleted

## 2011-08-21 NOTE — Telephone Encounter (Signed)
Left message on voicemail at home number.

## 2011-08-21 NOTE — Telephone Encounter (Signed)
He's likely going to be sore for days.  If SOB, passing blood, or in severe pain, then he needs to be seen.  O/w it would be okay to talk regular pain meds and try to rest at home.

## 2011-08-21 NOTE — Telephone Encounter (Signed)
Patient called to let Dr. Para March know that he almost choked on a zip tie that was in his sandwich that he got from [facility] on Monday.  He was home alone when this happened and he was able to throw himself over the couch to help get it dislodged.  He stated that now he has pain under his ribs.  He is on pain medication for back pain and has been taking it.  He wanted to know if he should be seen.  Patient stated that he has no other pain, no problems breathing, swallowing, or eating.  Please advise.

## 2011-09-17 ENCOUNTER — Telehealth: Payer: Self-pay

## 2011-09-17 MED ORDER — HYDROCODONE-ACETAMINOPHEN 10-500 MG PO TABS: 1.0000 | ORAL_TABLET | Freq: Three times a day (TID) | ORAL | Status: AC

## 2011-09-17 NOTE — Telephone Encounter (Signed)
Please call in the change in the hydrocodone dose.  I want pt to f/u with me for 30 min visit in the next month (after he's had a chance to see how he's doing on the higher dose of meds). Thanks.

## 2011-09-17 NOTE — Telephone Encounter (Signed)
Left f/m for pt to call back. Medication phoned to [facility] as instructed.

## 2011-09-17 NOTE — Telephone Encounter (Signed)
Pt is to get pain med refilled this weekend and for 2 months pt's pain is not controlled with present meds. Pt has lower back pain that radiates to left hip and also neck pain. Pt said after takes med 1-2 hours later pain is coming back. Pt is using heat and tens unit also. Pt is taking Hydrocodone/APAP 7.5/500 mg one tab at 7am, 12 noon and 6 pm. Pt takes Gabapentin 300 mg tablet taking 900 mg tid and that has helped the nerve pain in leg. Pt also takes Methocarbamol 500 mg 6am, 12 noon and 6 pm. Pt would like med increased before he refills again. Pt uses [facility] and can be contacted [phone]. Pt understands Dr Para March will be in office this afternoon.Please advise.

## 2011-09-18 NOTE — Telephone Encounter (Signed)
Patient advised as instructed via telephone.  He will call back to schedule f/u appt.

## 2011-09-22 LAB — BASIC METABOLIC PANEL
BUN: 16
Chloride: 102
Potassium: 4.2
Sodium: 136

## 2011-09-22 LAB — CBC
HCT: 42.1
Hemoglobin: 14.2
MCV: 95.5
Platelets: 240
RBC: 4.41
WBC: 5.7

## 2011-09-24 ENCOUNTER — Other Ambulatory Visit: Payer: Self-pay | Admitting: *Deleted

## 2011-09-24 MED ORDER — GABAPENTIN 300 MG PO CAPS: 900.0000 mg | ORAL_CAPSULE | Freq: Three times a day (TID) | ORAL | Status: DC

## 2011-09-24 NOTE — Telephone Encounter (Signed)
Pharmacy needs new script, their hard copy reads one to two three times a day.  Please call in to [facility].

## 2011-09-25 NOTE — Telephone Encounter (Signed)
Sent!

## 2011-10-15 ENCOUNTER — Other Ambulatory Visit: Payer: Self-pay | Admitting: *Deleted

## 2011-10-15 MED ORDER — DONEPEZIL HCL 5 MG PO TABS: 5.0000 mg | ORAL_TABLET | Freq: Every day | ORAL | Status: DC

## 2011-10-15 MED ORDER — METHOCARBAMOL 500 MG PO TABS: 500.0000 mg | ORAL_TABLET | Freq: Three times a day (TID) | ORAL | Status: DC

## 2011-10-15 MED ORDER — HYDROCODONE-ACETAMINOPHEN 10-500 MG PO TABS: 1.0000 | ORAL_TABLET | Freq: Three times a day (TID) | ORAL | Status: AC

## 2011-10-15 NOTE — Telephone Encounter (Signed)
Please call in lortab rx.

## 2011-10-16 NOTE — Telephone Encounter (Signed)
Medication phoned to pharmacy.  

## 2011-12-03 ENCOUNTER — Other Ambulatory Visit: Payer: Self-pay | Admitting: *Deleted

## 2011-12-03 NOTE — Telephone Encounter (Signed)
Medication was not originally on meds list, added for refill request.

## 2011-12-04 MED ORDER — HYDROCODONE-ACETAMINOPHEN 10-500 MG PO TABS: 1.0000 | ORAL_TABLET | Freq: Four times a day (QID) | ORAL | Status: AC | PRN

## 2011-12-04 NOTE — Telephone Encounter (Signed)
Medication phoned to pharmacy.  

## 2011-12-04 NOTE — Telephone Encounter (Signed)
Was prev on med list.  Please call in.

## 2012-01-07 ENCOUNTER — Telehealth: Payer: Self-pay | Admitting: *Deleted

## 2012-01-07 ENCOUNTER — Other Ambulatory Visit: Payer: Self-pay

## 2012-01-07 MED ORDER — HYDROCODONE-ACETAMINOPHEN 10-500 MG PO TABS: 1.0000 | ORAL_TABLET | Freq: Three times a day (TID) | ORAL | Status: DC

## 2012-01-07 MED ORDER — METHOCARBAMOL 500 MG PO TABS: 500.0000 mg | ORAL_TABLET | Freq: Three times a day (TID) | ORAL | Status: DC

## 2012-01-07 NOTE — Telephone Encounter (Signed)
FYI:  This patient has made a phone call this morning to [facility] (triage) DEMANDING that his medication be refilled immediately because he has ran out.  He was very rude to [place] and she explained that you were seeing patients and would respond to his request as soon as possible.  She did put in the refill request at 9:04 am.  You responded to this refill request at 10:04 am and before I could phone the pharmacy to approve the request, he had phoned back again to [place] and said he was sitting in his car outside the pharmacy and EXPECTED his refill to be called in immediately.  He also was very rude and demanding to [place].  I phoned in the refill and called the patient on his cell phone to let him know it had been approved.  I explained to him that we have a 48 hour refill policy and that he needs to request his refills before running completely out of medication.  He then apologized and said that he thought he had another blister-pack of medication in his bag but when he went to get it out this morning, he realized it was empty.

## 2012-01-07 NOTE — Telephone Encounter (Signed)
Drew at [facility] called pt is there now requesting refill on Methocarbamol 500 mg and Hydrocodone/APAP 10/500 mg. (Hydrocodone had been removed from med list but I added it back to med list incase Dr Para March would refill for pt.)Pt last seen 07/31/11. Kenard Gower said pt is going to wait on refill at [facility] and when call back ask for Kenard Gower [phone].Please advise.

## 2012-01-07 NOTE — Telephone Encounter (Signed)
Robaxin sent, vicodin needs to be called in.  Please see about getting patient scheduled in next month.  Thanks.

## 2012-01-07 NOTE — Telephone Encounter (Signed)
I talked with staff about this.  It appears that the patient was out of meds and that led to the urgency of the situation, to get the pain under control.  I'll talk with patient about it at [facility].

## 2012-01-07 NOTE — Telephone Encounter (Signed)
Pt called back his back pain is severe and request med for pain be called in ASAP. I advised Dr Armanda Heritage nurse had been alerted and Dr Para March was in room with pt. Pt said would like call back at [phone] when med has been called in.

## 2012-01-07 NOTE — Telephone Encounter (Signed)
Medication phoned to pharmacy. Patient advised.  

## 2012-01-22 ENCOUNTER — Ambulatory Visit: Payer: PRIVATE HEALTH INSURANCE | Admitting: Family Medicine

## 2012-01-25 ENCOUNTER — Encounter: Payer: Self-pay | Admitting: Family Medicine

## 2012-01-25 ENCOUNTER — Ambulatory Visit (INDEPENDENT_AMBULATORY_CARE_PROVIDER_SITE_OTHER): Payer: PRIVATE HEALTH INSURANCE | Admitting: Family Medicine

## 2012-01-25 VITALS — BP 130/84 | HR 77 | Temp 97.6°F | Wt 251.0 lb

## 2012-01-25 DIAGNOSIS — G8929 Other chronic pain: Secondary | ICD-10-CM

## 2012-01-25 DIAGNOSIS — J329 Chronic sinusitis, unspecified: Secondary | ICD-10-CM

## 2012-01-25 DIAGNOSIS — E119 Type 2 diabetes mellitus without complications: Secondary | ICD-10-CM

## 2012-01-25 DIAGNOSIS — M549 Dorsalgia, unspecified: Secondary | ICD-10-CM

## 2012-01-25 DIAGNOSIS — E669 Obesity, unspecified: Secondary | ICD-10-CM

## 2012-01-25 LAB — HEMOGLOBIN A1C: Hgb A1c MFr Bld: 7.9 % — ABNORMAL HIGH (ref 4.6–6.5)

## 2012-01-25 MED ORDER — GABAPENTIN 300 MG PO CAPS: ORAL_CAPSULE | ORAL | Status: DC

## 2012-01-25 MED ORDER — AZITHROMYCIN 250 MG PO TABS: ORAL_TABLET | ORAL | Status: AC

## 2012-01-25 NOTE — Progress Notes (Signed)
DM2, no meds, due for A1c.  Occ checking sugar, controlled per patient.  Trying to sick with DM2 diet.   Back pain.  At baseline.  Having to stand up in the room today due to pain.  We talked about getting med refills.  He's barely able to get by with his current meds, unless he has a bad day or weather change.  Continues to have trouble with radicular sx and B paraspinal Lumbar pain.   URI sx duration of symptoms: ~1 month per patient rhinorrhea:yes congestion:yes ear pain: occ sore throat: no Cough: very rare, minimal Myalgias: at baseline due to back pain other concerns: having to use oxymetazone recently.   Has discolored rhinorrhea.   ROS: See HPI.  Otherwise negative.    Meds, vitals, and allergies reviewed.   GEN: nad, alert and oriented HEENT: mucous membranes moist, TM w/o erythema, nasal epithelium injected, OP with cobblestoning, max sinus ttp x2 NECK: supple w/o LA CV: rrr. PULM: ctab, no inc wob ABD: soft, +bs EXT: no edema Back with paraspinal L spine tenderness noted.  Walks with limp

## 2012-01-25 NOTE — Patient Instructions (Addendum)
Increase the gabapentin and let me know if that doesn't help.  Let the pharmacy know when you are running low on meds (about 1 week before your run out).   Try to limit the nose spray.  Drink plenty of fluids, take tylenol as needed, and gargle with warm salt water for your throat.  This should gradually improve.  Take care.  Let us know if you have other concerns.  Start the antibiotics today.   You can get your results through our phone system.  Follow the instructions on the blue card. Recheck labs in 6 months before a visit with me.

## 2012-01-26 ENCOUNTER — Other Ambulatory Visit: Payer: PRIVATE HEALTH INSURANCE

## 2012-01-26 DIAGNOSIS — J329 Chronic sinusitis, unspecified: Secondary | ICD-10-CM | POA: Insufficient documentation

## 2012-01-26 NOTE — Assessment & Plan Note (Signed)
Inc AM gabapentin and continue other meds.

## 2012-01-26 NOTE — Assessment & Plan Note (Signed)
D/w pt about weight loss.  

## 2012-01-26 NOTE — Assessment & Plan Note (Signed)
Start zmax, nontoxic.  Okay for outpatient f/u.

## 2012-01-26 NOTE — Assessment & Plan Note (Signed)
No new meds for DM2, work on weight and recheck later in 2013

## 2012-01-27 ENCOUNTER — Other Ambulatory Visit: Payer: Self-pay | Admitting: *Deleted

## 2012-01-27 ENCOUNTER — Telehealth: Payer: Self-pay | Admitting: Family Medicine

## 2012-01-27 MED ORDER — SUMATRIPTAN-NAPROXEN SODIUM 85-500 MG PO TABS: 1.0000 | ORAL_TABLET | ORAL | Status: DC | PRN

## 2012-01-27 MED ORDER — DONEPEZIL HCL 5 MG PO TABS: 5.0000 mg | ORAL_TABLET | Freq: Every day | ORAL | Status: DC

## 2012-01-27 MED ORDER — LISINOPRIL 10 MG PO TABS: 10.0000 mg | ORAL_TABLET | Freq: Every day | ORAL | Status: DC

## 2012-01-27 MED ORDER — MONTELUKAST SODIUM 10 MG PO TABS: 10.0000 mg | ORAL_TABLET | Freq: Every day | ORAL | Status: DC

## 2012-01-27 MED ORDER — SIMVASTATIN 20 MG PO TABS: 20.0000 mg | ORAL_TABLET | Freq: Every day | ORAL | Status: DC

## 2012-01-27 MED ORDER — GABAPENTIN 300 MG PO CAPS: ORAL_CAPSULE | ORAL | Status: DC

## 2012-01-27 NOTE — Telephone Encounter (Signed)
Patient called stating that he received a letter in the mail stating that he needs to start using [facility] which is located at [facility] for all maintenance medications.  Patient did not have the physical address for this pharmacy, he only provided the phone number [phone] and fax number [phone].  He stated that Dr. Para March gave him a Rx for Gabapentin recently and we is willing to bring that Rx back in to [place] if Dr. Para March agrees to send in all Rx's to this pharmacy.  I looked up the pharmacy is [facility] and it doesn't look like we have it listed in our database.  Please advise.

## 2012-01-27 NOTE — Telephone Encounter (Signed)
Printed letter. Please give to pt.  Thanks.

## 2012-01-27 NOTE — Telephone Encounter (Signed)
Patient received a summons for jury duty.  Patient would like to get a note excusing him from jury duty due to his back pain and sciatic nerve pain,neck pain and headaches. He said he wouldn't be able to sit through jury duty.The note needs to explain why his disability would prevent him from serving jury duty and how it effects his ability to serve.

## 2012-01-27 NOTE — Telephone Encounter (Signed)
I sent these by accident.  Please cancel them at [facility].  I printed the rxs.  Give to pt or fa after I sign.  Thanks.

## 2012-01-28 NOTE — Telephone Encounter (Signed)
Patient advised. Letter left at front desk for pick up.  

## 2012-01-29 NOTE — Telephone Encounter (Signed)
Rx's canceled at [facility].  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

## 2012-02-01 ENCOUNTER — Ambulatory Visit: Payer: PRIVATE HEALTH INSURANCE | Admitting: Family Medicine

## 2012-02-10 ENCOUNTER — Telehealth: Payer: Self-pay

## 2012-02-10 ENCOUNTER — Other Ambulatory Visit: Payer: Self-pay

## 2012-02-10 MED ORDER — HYDROCODONE-ACETAMINOPHEN 10-500 MG PO TABS: 1.0000 | ORAL_TABLET | Freq: Three times a day (TID) | ORAL | Status: DC

## 2012-02-10 NOTE — Telephone Encounter (Signed)
Patient called with concerns that the Gabapentin you have him on is not helping his pain at all. Wants to discuss with you.

## 2012-02-10 NOTE — Telephone Encounter (Signed)
Please call in

## 2012-02-10 NOTE — Telephone Encounter (Signed)
I called and LMOVM for patient.  See if you can get him on the phone tomorrow. Thanks.

## 2012-02-10 NOTE — Telephone Encounter (Signed)
Fax request received from pharmacy for refill of medication.  Okay to refill?

## 2012-02-10 NOTE — Telephone Encounter (Signed)
Rx called to pharmacy as instructed. 

## 2012-02-11 ENCOUNTER — Telehealth: Payer: Self-pay | Admitting: Family Medicine

## 2012-02-11 MED ORDER — METHOCARBAMOL 500 MG PO TABS: 500.0000 mg | ORAL_TABLET | Freq: Three times a day (TID) | ORAL | Status: DC

## 2012-02-11 NOTE — Telephone Encounter (Signed)
I talked to patient.  I would inc the robaxin to taking up to 1000mg  at a time, as needed, up to 6 a day.  He'll try to limit it to 4 or 5 a day, but he can go up to 6.  He'll let me know how he's doing.

## 2012-02-11 NOTE — Telephone Encounter (Signed)
See next phone note.

## 2012-02-11 NOTE — Telephone Encounter (Signed)
Patient returned your call. Call back # [phone]

## 2012-02-11 NOTE — Telephone Encounter (Signed)
Patient says he is hurting a lot.  The Gabapentin is taking care of the nerve pain but the joint and muscle pains are so bad he has not been able to get out of bed on some days.

## 2012-02-22 ENCOUNTER — Telehealth: Payer: Self-pay | Admitting: *Deleted

## 2012-02-22 MED ORDER — GABAPENTIN 300 MG PO CAPS: ORAL_CAPSULE | ORAL | Status: DC

## 2012-02-22 MED ORDER — METHOCARBAMOL 500 MG PO TABS: 500.0000 mg | ORAL_TABLET | Freq: Three times a day (TID) | ORAL | Status: DC

## 2012-02-22 MED ORDER — HYDROCODONE-ACETAMINOPHEN 10-500 MG PO TABS: 1.0000 | ORAL_TABLET | Freq: Three times a day (TID) | ORAL | Status: DC

## 2012-02-22 NOTE — Telephone Encounter (Signed)
Medication phoned to pharmacy. The note stated he needs RF's on all 3 meds.  The Gabapentin came off as "No Print".  Does it need to be sent?

## 2012-02-22 NOTE — Telephone Encounter (Signed)
I called pt.  We talked about his med regimen. Will stop the tylenol PM.  Can take up to 5 of the vicodin and methocarbamol each per day. Take 1200mg  of neurontin tid as this is helping. He's had prev ortho/PT eval/tx and we talked about his.  He has been injected prev.  He continues to stretch.  I would continue as above and then if not improved, we'll need to get ortho input again.  He understood.  Robaxin rx sent, please call in the vicodin rx.  Thanks.

## 2012-02-22 NOTE — Telephone Encounter (Signed)
Patient called to let Dr. Para March know how he was doing.  He stated 1) He has doubled the morning and afternoon doses of Hydrocodone and Methocarbamol.    2) He takes one each of Hydrocodone and Methocarbamol in the evening along with Gabapentin.    3) He wants to know if he can increase the Gabapentin to four times in the morning, afternoon, and at bedtime?    4) He takes three Tylenol PM tablets along with three 300mg  tablets of Gabapentin at bedtime.    5) Patient is requesting refills on all three medications.  Uses [facility].

## 2012-02-22 NOTE — Telephone Encounter (Signed)
I talked to him and he's good on the gabapentin.  I didn't clarify.  Thanks.

## 2012-03-23 ENCOUNTER — Telehealth: Payer: Self-pay

## 2012-03-23 NOTE — Telephone Encounter (Signed)
Pt left v/m allergy med not helping. I called pt for more details and left v/m for pt to call back.

## 2012-03-24 NOTE — Telephone Encounter (Signed)
Left vm message for pt  to call back

## 2012-03-25 ENCOUNTER — Telehealth: Payer: Self-pay | Admitting: *Deleted

## 2012-03-25 MED ORDER — FLUTICASONE PROPIONATE 50 MCG/ACT NA SUSP: 1.0000 | Freq: Every day | NASAL | Status: DC

## 2012-03-25 NOTE — Telephone Encounter (Signed)
Patient called and left message at triage earlier in the week and triage tried to phone him back for more information.  Patient says he has been using Singulair and it seems that his allergies are getting worse.  He says he is using his nasal spray but you had told him not to use it so often.  Uses [facility].

## 2012-03-25 NOTE — Telephone Encounter (Signed)
I would try to use flonase 1-2 sprays per nostril per day.  I don't think he has been on that prev.  See if that helps.  I couldn't send the rx.  I printed it.  Thanks.

## 2012-03-25 NOTE — Telephone Encounter (Signed)
Faxed

## 2012-03-31 NOTE — Telephone Encounter (Signed)
See 03/25/12 note.(another note was opened ).

## 2012-04-18 ENCOUNTER — Other Ambulatory Visit: Payer: Self-pay | Admitting: *Deleted

## 2012-04-18 NOTE — Telephone Encounter (Signed)
Faxed refill request   

## 2012-04-19 MED ORDER — HYDROCODONE-ACETAMINOPHEN 10-500 MG PO TABS: 1.0000 | ORAL_TABLET | Freq: Three times a day (TID) | ORAL | Status: DC

## 2012-04-19 NOTE — Telephone Encounter (Signed)
Please call in

## 2012-04-19 NOTE — Telephone Encounter (Signed)
Medication phoned to pharmacy.  

## 2012-05-02 ENCOUNTER — Telehealth: Payer: Self-pay

## 2012-05-02 MED ORDER — GABAPENTIN 300 MG PO CAPS: ORAL_CAPSULE | ORAL | Status: DC

## 2012-05-02 NOTE — Telephone Encounter (Signed)
Addended by: Josph Macho A on: 05/02/2012 02:06 PM   Modules accepted: Orders

## 2012-05-02 NOTE — Telephone Encounter (Signed)
Rx sent in and patient notified.  

## 2012-05-02 NOTE — Telephone Encounter (Signed)
Pt presently taking Gabapentin 300 mg 4 tabs in AM, 3 tabs at lunch and 3 tabs in evening. Pt request increase of Gabapentin 300 mg to read 4 tabs in AM, 4 tabs at lunch and 3 tabs in evening due to nerve pain in left leg late afternoon and early evening. Pt uses [facility] and pt will give fax # when nurse calls back. Pt last seen 01/25/12.Pt is out of med and since Dr Para March not reviewing computer until Wed please advise. Pt can be reached at [phone].

## 2012-05-02 NOTE — Telephone Encounter (Signed)
Ok to increase.  I've placed instructions as 4 tablets tid and he may take only 3 in evening. plz fax.

## 2012-06-17 ENCOUNTER — Other Ambulatory Visit: Payer: Self-pay | Admitting: *Deleted

## 2012-06-17 MED ORDER — HYDROCODONE-ACETAMINOPHEN 10-500 MG PO TABS: 1.0000 | ORAL_TABLET | Freq: Three times a day (TID) | ORAL | Status: DC

## 2012-06-17 NOTE — Telephone Encounter (Signed)
Faxed refill request from [facility], last filled 05/23/12.

## 2012-06-17 NOTE — Telephone Encounter (Signed)
Please call in

## 2012-06-17 NOTE — Telephone Encounter (Signed)
Medication phoned to pharmacy.  

## 2012-08-15 ENCOUNTER — Other Ambulatory Visit: Payer: Self-pay | Admitting: *Deleted

## 2012-08-15 MED ORDER — METHOCARBAMOL 500 MG PO TABS: 500.0000 mg | ORAL_TABLET | Freq: Three times a day (TID) | ORAL | Status: DC

## 2012-08-15 MED ORDER — HYDROCODONE-ACETAMINOPHEN 10-500 MG PO TABS: 1.0000 | ORAL_TABLET | Freq: Three times a day (TID) | ORAL | Status: DC

## 2012-08-15 NOTE — Telephone Encounter (Signed)
Please call in hydrocodone and schedule OV with labs ahead of time.

## 2012-08-15 NOTE — Telephone Encounter (Signed)
Faxed refill request. Please advise.  

## 2012-08-16 NOTE — Telephone Encounter (Signed)
Medication phoned to pharmacy.  

## 2012-08-16 NOTE — Telephone Encounter (Signed)
Spoke with pt/// he scheduled a 30 min office visit and would like his Hydrocodone called into his pharmacy. Pt was very upset and did not understand why he had to come back in. I tried to explain to him but he was pretty unreasonable but finally stated "he would do what he had to do to get his meds."

## 2012-08-17 ENCOUNTER — Other Ambulatory Visit (INDEPENDENT_AMBULATORY_CARE_PROVIDER_SITE_OTHER): Payer: PRIVATE HEALTH INSURANCE

## 2012-08-17 DIAGNOSIS — E119 Type 2 diabetes mellitus without complications: Secondary | ICD-10-CM

## 2012-08-17 LAB — COMPREHENSIVE METABOLIC PANEL
ALT: 37 U/L (ref 0–53)
AST: 25 U/L (ref 0–37)
Albumin: 4.2 g/dL (ref 3.5–5.2)
Alkaline Phosphatase: 54 U/L (ref 39–117)
BUN: 19 mg/dL (ref 6–23)
Potassium: 4.3 mEq/L (ref 3.5–5.1)
Sodium: 135 mEq/L (ref 135–145)

## 2012-08-17 LAB — LIPID PANEL
HDL: 34.8 mg/dL — ABNORMAL LOW (ref >39.00)
Total CHOL/HDL Ratio: 6
VLDL: 137.2 mg/dL — ABNORMAL HIGH (ref 0.0–40.0)

## 2012-08-17 LAB — LDL CHOLESTEROL, DIRECT: Direct LDL: 96.1 mg/dL

## 2012-08-19 ENCOUNTER — Encounter: Payer: Self-pay | Admitting: Family Medicine

## 2012-08-19 ENCOUNTER — Ambulatory Visit (INDEPENDENT_AMBULATORY_CARE_PROVIDER_SITE_OTHER): Payer: PRIVATE HEALTH INSURANCE | Admitting: Family Medicine

## 2012-08-19 VITALS — BP 148/90 | HR 81 | Temp 98.2°F | Wt 256.0 lb

## 2012-08-19 DIAGNOSIS — E782 Mixed hyperlipidemia: Secondary | ICD-10-CM

## 2012-08-19 DIAGNOSIS — E119 Type 2 diabetes mellitus without complications: Secondary | ICD-10-CM

## 2012-08-19 DIAGNOSIS — M549 Dorsalgia, unspecified: Secondary | ICD-10-CM

## 2012-08-19 DIAGNOSIS — G8929 Other chronic pain: Secondary | ICD-10-CM

## 2012-08-19 NOTE — Assessment & Plan Note (Signed)
Labs discussed.  He'll work on diet and we'll follow.  Will not inc statin for now.

## 2012-08-19 NOTE — Assessment & Plan Note (Signed)
This is the most comfortable I've seen him, but he is still with sig pain and now with weakness in L leg.  Will check MRI.  Pt agrees with plan.  He isn't having ADE from meds.  Not sedated or engaged in high risk behavior.  Continue current meds for now.
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
## 2012-08-19 NOTE — Patient Instructions (Addendum)
Work on [employer] in the meantime.  Recheck A1c in 3 months.  We'll be in touch after that.  Plan on rechecking A1c again in 6 months with visit with Para March a few days later.  See Shirlee Limerick about your referral before you leave today. Take care.

## 2012-08-19 NOTE — Assessment & Plan Note (Signed)
No new meds.  Will work on diet.  Recheck in 3 months.  Likely address with metformin at that point if not improved.  Plan on 6 month f/u with A1c ahead of that.  Labs discussed.

## 2012-09-02 ENCOUNTER — Telehealth: Payer: Self-pay | Admitting: Family Medicine

## 2012-09-02 DIAGNOSIS — M549 Dorsalgia, unspecified: Secondary | ICD-10-CM

## 2012-09-02 NOTE — Telephone Encounter (Signed)
Patient notified by Shirlee Limerick.  Will await appt with Dr. Yves Dill.

## 2012-09-02 NOTE — Telephone Encounter (Signed)
Please call pt.  I talked with Dr. Yves Dill about this case.  I would like for him to see Dr. Yves Dill (I'll still see the patient, but I want this doc's input).  I think it could really be beneficial for him with his back pain.  I put in the referral.  Thanks.

## 2012-09-09 ENCOUNTER — Telehealth: Payer: Self-pay

## 2012-09-09 NOTE — Telephone Encounter (Signed)
Patient advised.

## 2012-09-09 NOTE — Telephone Encounter (Signed)
pts wife,Leslie left v/m requesting cb; f/u questions to MRI performed at [facility]. What does no changes mean? Why was an MRI not done on pts neck and hip:pt having pain there also. Pt has appt with pain specialist on Tues. Should further testing be done before appt.Please advise.

## 2012-09-09 NOTE — Telephone Encounter (Signed)
Please call pt.  There wasn't a new finding seen on the MR- no pinched nerve, bulging disk, etc.  I didn't seen a obvious cause for the pain.  I expected to see something to explain the pain.  Since I didn't see a cause, I need extra help and that would be the reason to send him to Dr. Yves Dill.

## 2012-10-10 ENCOUNTER — Other Ambulatory Visit: Payer: Self-pay | Admitting: *Deleted

## 2012-10-10 NOTE — Telephone Encounter (Signed)
Faxed refill request. Please advise.  

## 2012-10-11 MED ORDER — HYDROCODONE-ACETAMINOPHEN 10-500 MG PO TABS: 1.0000 | ORAL_TABLET | Freq: Three times a day (TID) | ORAL | Status: DC

## 2012-10-11 NOTE — Telephone Encounter (Signed)
Please call in

## 2012-10-11 NOTE — Telephone Encounter (Signed)
Medication phoned to pharmacy.  

## 2012-11-07 ENCOUNTER — Other Ambulatory Visit: Payer: Self-pay | Admitting: *Deleted

## 2012-11-07 NOTE — Telephone Encounter (Signed)
This was sent in with a refill 10/10/12.  He should have a refill left.

## 2012-11-07 NOTE — Telephone Encounter (Signed)
Spoke with pharmacist.  She says she will pull the hard copy to investigate and will call back if necessary.

## 2012-11-28 ENCOUNTER — Other Ambulatory Visit: Payer: Self-pay | Admitting: *Deleted

## 2012-11-28 NOTE — Telephone Encounter (Signed)
Faxed refill request.  Last filled 11/07/12  Please advise.

## 2012-11-29 MED ORDER — HYDROCODONE-ACETAMINOPHEN 10-500 MG PO TABS: 1.0000 | ORAL_TABLET | Freq: Three times a day (TID) | ORAL | Status: DC

## 2012-11-29 NOTE — Telephone Encounter (Signed)
Rx phoned to pharmacy with notation "not to be filled until 12/02/12.

## 2012-11-29 NOTE — Telephone Encounter (Signed)
Please call in but to be filled on 12/02/12.  This is early now.  Thanks.

## 2013-01-24 ENCOUNTER — Other Ambulatory Visit: Payer: Self-pay | Admitting: *Deleted

## 2013-01-24 MED ORDER — HYDROCODONE-ACETAMINOPHEN 10-500 MG PO TABS: 1.0000 | ORAL_TABLET | Freq: Three times a day (TID) | ORAL | Status: DC

## 2013-01-24 MED ORDER — METHOCARBAMOL 500 MG PO TABS: 500.0000 mg | ORAL_TABLET | Freq: Three times a day (TID) | ORAL | Status: DC

## 2013-01-24 NOTE — Telephone Encounter (Signed)
Patient advised that 30 OV is needed with labs before or at OV.  He will call back for appt so as to correlate with other appts.  Medication phoned to pharmacy.

## 2013-01-24 NOTE — Telephone Encounter (Signed)
Please call in hydrocodone; methocarbamol sent.  He needs OV with labs either at the OV or ahead of time.

## 2013-02-17 ENCOUNTER — Telehealth: Payer: Self-pay | Admitting: *Deleted

## 2013-02-17 DIAGNOSIS — E119 Type 2 diabetes mellitus without complications: Secondary | ICD-10-CM

## 2013-02-17 NOTE — Telephone Encounter (Signed)
Patient has called in to schedule his 30 min OV and labs prior as requested on his last refill of meds.  Does he need to be fasting for these labs?

## 2013-02-18 NOTE — Telephone Encounter (Signed)
Yes. Thanks 

## 2013-02-20 NOTE — Telephone Encounter (Signed)
Pt advised.  Fasting lab appt scheduled.

## 2013-02-21 ENCOUNTER — Other Ambulatory Visit (INDEPENDENT_AMBULATORY_CARE_PROVIDER_SITE_OTHER): Payer: PRIVATE HEALTH INSURANCE

## 2013-02-21 DIAGNOSIS — E119 Type 2 diabetes mellitus without complications: Secondary | ICD-10-CM

## 2013-02-21 LAB — LIPID PANEL
HDL: 31.3 mg/dL — ABNORMAL LOW (ref >39.00)
Total CHOL/HDL Ratio: 7

## 2013-02-21 LAB — LDL CHOLESTEROL, DIRECT: Direct LDL: 123.6 mg/dL

## 2013-02-22 ENCOUNTER — Ambulatory Visit (INDEPENDENT_AMBULATORY_CARE_PROVIDER_SITE_OTHER): Payer: PRIVATE HEALTH INSURANCE | Admitting: Family Medicine

## 2013-02-22 VITALS — BP 150/88 | HR 90 | Temp 97.9°F | Wt 254.0 lb

## 2013-02-22 DIAGNOSIS — G8929 Other chronic pain: Secondary | ICD-10-CM

## 2013-02-22 DIAGNOSIS — R413 Other amnesia: Secondary | ICD-10-CM

## 2013-02-22 DIAGNOSIS — E119 Type 2 diabetes mellitus without complications: Secondary | ICD-10-CM

## 2013-02-22 DIAGNOSIS — M549 Dorsalgia, unspecified: Secondary | ICD-10-CM

## 2013-02-22 MED ORDER — DONEPEZIL HCL 10 MG PO TABS: 10.0000 mg | ORAL_TABLET | Freq: Every day | ORAL | Status: DC

## 2013-02-22 MED ORDER — METHOCARBAMOL 500 MG PO TABS: 500.0000 mg | ORAL_TABLET | Freq: Three times a day (TID) | ORAL | Status: DC

## 2013-02-22 MED ORDER — SIMVASTATIN 20 MG PO TABS: 20.0000 mg | ORAL_TABLET | Freq: Every day | ORAL | Status: DC

## 2013-02-22 MED ORDER — HYDROCODONE-ACETAMINOPHEN 10-325 MG PO TABS: ORAL_TABLET | ORAL | Status: DC

## 2013-02-22 NOTE — Patient Instructions (Addendum)
Increase the aricept to 10mg  a day and see if that helps with your memory.  I'll be in touch with Dr. Yves Dill in the meantime.   Keep working on [employer].   Recheck in 6 months, sooner if needed.  30 min visit.

## 2013-02-23 ENCOUNTER — Telehealth: Payer: Self-pay

## 2013-02-23 NOTE — Assessment & Plan Note (Signed)
26/30 today, with -2 fo orientation and -2 for recall. 5/5 on serial 3s.  Will inc aricept to 10mg  and follow clinically. He agrees.
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
## 2013-02-23 NOTE — Assessment & Plan Note (Signed)
Sciatica continues. Had f/u with Dr. Yves Dill; requesting records. Still with chronic severe pain. Overall the gabapentin helped. No ADE from meds. No diversion, no sedation. The current meds make the pain tolerable. Still walking hunched over and using a cane. No new injuries. Using heat, massage etc at home.   Will review Chasnis' notes and then notify pt if I see other options. O/w continue as is for now. He agrees.   I continue to treat his pain in good faith and I have every reason to believe that he continues to be in pain that is made more tolerable w/o ADE on the current meds.  >25 min spent with face to face with patient, >50% counseling and/or coordinating care

## 2013-02-23 NOTE — Assessment & Plan Note (Signed)
No meds, continue to work on diet.  Exercise is limited.

## 2013-02-23 NOTE — Telephone Encounter (Signed)
Pt seen 02/22/13 and received written rx for 4 meds. Pt request  Simvastatin and  Aricept called to [facility] in [place] and Hydrocodone and Robaxin called to [facility] in [place]. Pt said down in back and does not feel like leaving home today to get filled. Advised pt if he brings prescriptions back to our office can call med in. Pt said he will see if his wife can take prescriptions and get them filled. Any further problem pt will call back.

## 2013-03-10 ENCOUNTER — Other Ambulatory Visit: Payer: Self-pay | Admitting: *Deleted

## 2013-03-10 MED ORDER — LISINOPRIL 10 MG PO TABS: 10.0000 mg | ORAL_TABLET | Freq: Every day | ORAL | Status: DC

## 2013-03-10 MED ORDER — MONTELUKAST SODIUM 10 MG PO TABS: 10.0000 mg | ORAL_TABLET | Freq: Every day | ORAL | Status: DC

## 2013-03-14 ENCOUNTER — Other Ambulatory Visit: Payer: Self-pay

## 2013-03-14 MED ORDER — GABAPENTIN 300 MG PO CAPS: ORAL_CAPSULE | ORAL | Status: DC

## 2013-03-14 NOTE — Telephone Encounter (Signed)
Sent!

## 2013-03-14 NOTE — Telephone Encounter (Signed)
Pt request refill gabapentin to [facility].Please advise.

## 2013-03-15 ENCOUNTER — Other Ambulatory Visit: Payer: Self-pay

## 2013-03-15 MED ORDER — LISINOPRIL 10 MG PO TABS: 10.0000 mg | ORAL_TABLET | Freq: Every day | ORAL | Status: DC

## 2013-03-15 NOTE — Telephone Encounter (Signed)
Pt request refill lisinopril 10 mg to [facility]; [facility] said [facility]. Medication phoned to [facility] (spoke with [facility])  pharmacy as instructed.Pt notified done.

## 2013-05-19 ENCOUNTER — Other Ambulatory Visit: Payer: Self-pay | Admitting: *Deleted

## 2013-05-19 MED ORDER — METHOCARBAMOL 500 MG PO TABS: 500.0000 mg | ORAL_TABLET | Freq: Three times a day (TID) | ORAL | Status: DC

## 2013-05-19 MED ORDER — HYDROCODONE-ACETAMINOPHEN 10-325 MG PO TABS: ORAL_TABLET | ORAL | Status: DC

## 2013-05-19 NOTE — Telephone Encounter (Signed)
Pt left v/m requesting status of refills on hydrocodone apap and methocarbamol; pt request refill done today with callback to pt. Pt going out of town 05/20/13 and needs med before leaves town.

## 2013-05-19 NOTE — Telephone Encounter (Signed)
Drew from [facility] called for hydrocodone apap refill. Medication given to [facility] as instructed.

## 2013-05-19 NOTE — Telephone Encounter (Signed)
Last filled 04/22/13

## 2013-05-19 NOTE — Telephone Encounter (Signed)
plz phone in vicodin and notify pt.

## 2013-07-10 ENCOUNTER — Other Ambulatory Visit: Payer: Self-pay | Admitting: Family Medicine

## 2013-07-10 NOTE — Telephone Encounter (Signed)
To PCP

## 2013-07-10 NOTE — Telephone Encounter (Signed)
Please call in the hydrocodone to be filled on/after 07/14/13.  It's too early currently.  Pt needs f/u visit. .  Thanks.

## 2013-07-11 NOTE — Telephone Encounter (Signed)
Medication phoned to pharmacy.  

## 2013-07-12 ENCOUNTER — Other Ambulatory Visit: Payer: Self-pay | Admitting: Family Medicine

## 2013-07-12 ENCOUNTER — Other Ambulatory Visit: Payer: Self-pay | Admitting: *Deleted

## 2013-07-12 MED ORDER — GABAPENTIN 300 MG PO CAPS: ORAL_CAPSULE | ORAL | Status: DC

## 2013-07-12 NOTE — Telephone Encounter (Signed)
I already sent it on the first request for #360, ie 30 day supply.  I would leave it as is since the quantity is as high as it is.

## 2013-07-12 NOTE — Telephone Encounter (Signed)
Received refill request electronically from pharmacy. Last office visit 02/22/13.

## 2013-07-12 NOTE — Telephone Encounter (Signed)
Sent!

## 2013-07-12 NOTE — Telephone Encounter (Signed)
Pt left v/m requesting 90 day prescription for Gabapentin. Pt request cb.

## 2013-07-13 ENCOUNTER — Other Ambulatory Visit: Payer: Self-pay | Admitting: Family Medicine

## 2013-09-08 ENCOUNTER — Other Ambulatory Visit: Payer: Self-pay

## 2013-09-08 MED ORDER — HYDROCODONE-ACETAMINOPHEN 10-325 MG PO TABS: ORAL_TABLET | ORAL | Status: DC

## 2013-09-08 MED ORDER — METHOCARBAMOL 500 MG PO TABS: ORAL_TABLET | ORAL | Status: DC

## 2013-09-08 NOTE — Telephone Encounter (Signed)
Pt called and wants to know if medication has been filled. Told pt that message has not been addressed yet, but has been flagged as priority.  Pt wanted to know what priority meant, explained Dr. Para March was seeing patients at this time and he would take care of messages between patients or at the end of the day. Mr. Maahs concerned because his pharmacy of choice closes at 5 pm today.

## 2013-09-08 NOTE — Telephone Encounter (Signed)
Medication phoned to pharmacy. Patient advised.  

## 2013-09-08 NOTE — Telephone Encounter (Signed)
There are multiple issues here.  We need at least 1 full business day to process rxs.  We can't change doses over the phone like this.  He is/was due already for 6 month fu.   If he had come in, we already would have addressed this.   Please call in the medicines as listed.

## 2013-09-08 NOTE — Telephone Encounter (Signed)
Pt request refill of hydrocodone apap and methocarbamol to [facility]. Pt request larger quantity of meds if possible. Pt needs refill done today before 5 pm. Pt request cb when done.

## 2013-10-04 ENCOUNTER — Ambulatory Visit (INDEPENDENT_AMBULATORY_CARE_PROVIDER_SITE_OTHER): Payer: PRIVATE HEALTH INSURANCE | Admitting: Family Medicine

## 2013-10-04 ENCOUNTER — Encounter: Payer: Self-pay | Admitting: Family Medicine

## 2013-10-04 ENCOUNTER — Encounter: Payer: Self-pay | Admitting: Radiology

## 2013-10-04 ENCOUNTER — Ambulatory Visit: Payer: PRIVATE HEALTH INSURANCE | Admitting: Family Medicine

## 2013-10-04 ENCOUNTER — Other Ambulatory Visit: Payer: Self-pay | Admitting: *Deleted

## 2013-10-04 VITALS — BP 140/100 | HR 84 | Temp 98.2°F | Wt 242.2 lb

## 2013-10-04 DIAGNOSIS — M5126 Other intervertebral disc displacement, lumbar region: Secondary | ICD-10-CM

## 2013-10-04 DIAGNOSIS — E119 Type 2 diabetes mellitus without complications: Secondary | ICD-10-CM

## 2013-10-04 DIAGNOSIS — Z23 Encounter for immunization: Secondary | ICD-10-CM

## 2013-10-04 MED ORDER — FENTANYL 25 MCG/HR TD PT72: 1.0000 | MEDICATED_PATCH | TRANSDERMAL | Status: DC

## 2013-10-04 MED ORDER — HYDROCODONE-ACETAMINOPHEN 10-325 MG PO TABS: ORAL_TABLET | ORAL | Status: DC

## 2013-10-04 MED ORDER — METHOCARBAMOL 500 MG PO TABS: ORAL_TABLET | ORAL | Status: DC

## 2013-10-04 NOTE — Patient Instructions (Signed)
Go to the lab on the way out.  We'll contact you with your lab report.  Put on 1 patch of fentanyl every 3 three days.  Don't put on extra patches or put a patch on early.  That is for the baseline pain.  When you have more pain that the patch doesn't help, then take a hydrocodone.  The hydrocodone is only for breakthrough pain.  You will take fewer hydrocodone pills now.  Be careful for sedation with the med change.  We can't call in these meds.  You (a designated family member) will have to pick them up monthly.  Give me an update in a few days.   Take care.

## 2013-10-04 NOTE — Telephone Encounter (Signed)
Sent!

## 2013-10-04 NOTE — Telephone Encounter (Signed)
Last filled 09/08/2013

## 2013-10-05 LAB — HEMOGLOBIN A1C: Hgb A1c MFr Bld: 8.7 % — ABNORMAL HIGH (ref 4.6–6.5)

## 2013-10-06 ENCOUNTER — Encounter: Payer: Self-pay | Admitting: Family Medicine

## 2013-10-06 NOTE — Assessment & Plan Note (Signed)
D/w pt about diet and weight.  See notes on labs.

## 2013-10-06 NOTE — Assessment & Plan Note (Signed)
I am trying to treat him in good faith.  No sedation or ADE from meds. Would cut dose of hydrocodone, have him change to fentanyl patch and update me in a few days.  D/w pt at length about baseline vs breakthrough pain medicine, caution for sedation.  He understood all. UDS done today at [facility]. >25 min spent with face to face with patient, >50% counseling and/or coordinating care.
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
## 2013-10-09 ENCOUNTER — Telehealth: Payer: Self-pay | Admitting: *Deleted

## 2013-10-09 NOTE — Telephone Encounter (Signed)
Patient advised.

## 2013-10-09 NOTE — Telephone Encounter (Signed)
Keep going with the patch as is and I want an update in about 2-3 more days.  We may need to adjust his dose, but I wouldn't do it yet. Thanks.

## 2013-10-09 NOTE — Telephone Encounter (Signed)
Call was made to patient concerning some lab results and Dr. Para March asked that I get an update on the patient's pain level.  He states that he has just put on the 2nd pain patch and takes 1 pain pill three times daily.  He is not sure if the it will take longer for the pain patch to reach maximum efficacy but right now, he feels that this regimen is not fully getting him to the level of pain relief that he would like.

## 2013-10-13 ENCOUNTER — Other Ambulatory Visit: Payer: Self-pay | Admitting: *Deleted

## 2013-10-13 MED ORDER — MONTELUKAST SODIUM 10 MG PO TABS: 10.0000 mg | ORAL_TABLET | Freq: Every day | ORAL | Status: DC

## 2013-10-20 ENCOUNTER — Encounter: Payer: Self-pay | Admitting: Family Medicine

## 2013-10-26 ENCOUNTER — Telehealth: Payer: Self-pay

## 2013-10-26 MED ORDER — FENTANYL 25 MCG/HR TD PT72: 50.0000 ug | MEDICATED_PATCH | TRANSDERMAL | Status: DC

## 2013-10-26 NOTE — Telephone Encounter (Signed)
Pt said taking hydrocodone, methocarbamol and gabapentin. Last visit decreased hydrocodone dosage and gave pt fentanyl patch. Fentanyl patch is no more effective than taking 2 of the hydrocodone.Please advise. Pt is still having significant spikes of pain even using the Fentanyl patch.Please advise. Medical [employer].  Pain level now is 7; pt said having to stay in bed for last 2 days. Pt is using tens unit and ice packs.Pt request cb.

## 2013-10-26 NOTE — Telephone Encounter (Signed)
I would use 2 patches at a time (start them together on the next scheduled change) and then notify us.  Would not go above 3 hydrocodone a day in the meantime.

## 2013-10-26 NOTE — Telephone Encounter (Signed)
Pt verbalized understanding.  Will call us with update.

## 2013-10-27 ENCOUNTER — Other Ambulatory Visit: Payer: Self-pay

## 2013-10-27 MED ORDER — FENTANYL 50 MCG/HR TD PT72: 50.0000 ug | MEDICATED_PATCH | TRANSDERMAL | Status: DC

## 2013-10-27 NOTE — Telephone Encounter (Signed)
Patient advised.  Rx left at front desk for pick up. 

## 2013-10-27 NOTE — Telephone Encounter (Signed)
I upped the strength to per patch.  That is the equivalent of 2x40mcg patches.  He'll just apply ONE of these at a time.  Have him update [place] with pain control info next week.  Thanks.

## 2013-10-27 NOTE — Telephone Encounter (Signed)
Pt just put 2 patches on last night and pt only has one patch left. Since change in instructions(using 2 patches instead of 1 patch) Pt request to pick up rx today. Pt will need time to arrange a ride to the office so appreciates a cb asap.

## 2013-10-31 ENCOUNTER — Other Ambulatory Visit: Payer: Self-pay

## 2013-10-31 NOTE — Telephone Encounter (Signed)
Pt request refill hydrocodone apap. Call when ready for pick up.

## 2013-11-01 MED ORDER — HYDROCODONE-ACETAMINOPHEN 10-325 MG PO TABS: ORAL_TABLET | ORAL | Status: DC

## 2013-11-01 NOTE — Telephone Encounter (Signed)
Patient notified that script is up front ready for pickup. 

## 2013-11-01 NOTE — Telephone Encounter (Signed)
Pt left v/m requesting cb on status of rx.[phone].

## 2013-11-01 NOTE — Telephone Encounter (Signed)
Printed.  Please give to patient after I sign.  Thanks.

## 2013-11-02 ENCOUNTER — Other Ambulatory Visit: Payer: Self-pay | Admitting: *Deleted

## 2013-11-02 MED ORDER — LISINOPRIL 10 MG PO TABS: 10.0000 mg | ORAL_TABLET | Freq: Every day | ORAL | Status: DC

## 2013-11-14 ENCOUNTER — Other Ambulatory Visit: Payer: Self-pay | Admitting: Family Medicine

## 2013-11-14 NOTE — Telephone Encounter (Signed)
Sent!

## 2013-11-14 NOTE — Telephone Encounter (Signed)
Electronic refill request.  Please advise. 

## 2013-11-14 NOTE — Telephone Encounter (Signed)
Patient advised.

## 2013-11-14 NOTE — Telephone Encounter (Signed)
Pt called and pt needs gabapentin refilled today because wife will not be in [place] on 11/15/13. Pt request cb when refilled; [phone].

## 2013-11-23 ENCOUNTER — Other Ambulatory Visit: Payer: Self-pay

## 2013-11-23 MED ORDER — HYDROCODONE-ACETAMINOPHEN 10-325 MG PO TABS: ORAL_TABLET | ORAL | Status: DC

## 2013-11-23 MED ORDER — FENTANYL 50 MCG/HR TD PT72: 50.0000 ug | MEDICATED_PATCH | TRANSDERMAL | Status: DC

## 2013-11-23 NOTE — Telephone Encounter (Signed)
Pt request rx fentanyl patch. Call pt when ready for pick up. Pt will put last patch on 11/24/13. Pt said fentanyl has helped pain but not quite to goal. Pt wants to know if should increase mcg. Pt also wants to pick up Hydrocodone apap rx; not due until next week but would like to make one trip to office only.Please advise.

## 2013-11-23 NOTE — Telephone Encounter (Signed)
Printed. I would leave as is for now, ie for the next month and then have him report back.  Thanks.

## 2013-11-24 NOTE — Telephone Encounter (Signed)
Left detailed message on voicemail. Rx left at front desk for pick up.  

## 2013-12-22 ENCOUNTER — Other Ambulatory Visit: Payer: Self-pay

## 2013-12-22 NOTE — Telephone Encounter (Signed)
Pt left v/m;pt said fentanyl 50 mcg request refill; pt has just applied a patch today and has one more patch to apply in 3 days. Pt said fentanyl is helpful but does not completely control pts pain level and  wants to know if strength could be increased; pt said if cannot be increased request refill for fentanyl 50 mcg. Pt request cb.

## 2013-12-25 MED ORDER — HYDROCODONE-ACETAMINOPHEN 10-325 MG PO TABS: ORAL_TABLET | ORAL | Status: DC

## 2013-12-25 MED ORDER — FENTANYL 50 MCG/HR TD PT72: 50.0000 ug | MEDICATED_PATCH | TRANSDERMAL | Status: DC

## 2013-12-25 NOTE — Telephone Encounter (Signed)
See prev note, Dr. [name] in office so Routed message to him and also gave [place] the fentanyl Rx incase Dr. [name] wants to increase the strength

## 2013-12-25 NOTE — Telephone Encounter (Signed)
Pt notified Rx ready for pick-up pt also advised to check with Dr. [name] when he comes back  Pt also request a refill on pain Rx, pt knows it's a few days early but wants to pick it up with he picks of his fentanyl patches, pt said last month they refilled it early but just put on the Rx "Fill on/after 11/29/13"

## 2013-12-25 NOTE — Telephone Encounter (Signed)
Patient advised.  Rx left at front desk for pick up. 

## 2013-12-25 NOTE — Telephone Encounter (Signed)
I would not increase the fentanyl yet.  Would continue as is for now, we may need to inc his fent/dec his hydrocodone in the future.  Hydrocodone printed but initially with a date error- corrected and reprinted.  Thanks to all.

## 2013-12-25 NOTE — Telephone Encounter (Signed)
I will refill times one in absence of PCP  He will need to speak to his PCP re: increase when he returns  Px printed for pick up in IN box

## 2014-01-05 ENCOUNTER — Other Ambulatory Visit: Payer: Self-pay | Admitting: Family Medicine

## 2014-01-05 DIAGNOSIS — E119 Type 2 diabetes mellitus without complications: Secondary | ICD-10-CM

## 2014-01-08 ENCOUNTER — Other Ambulatory Visit: Payer: PRIVATE HEALTH INSURANCE

## 2014-01-11 ENCOUNTER — Ambulatory Visit: Payer: PRIVATE HEALTH INSURANCE | Admitting: Family Medicine

## 2014-01-12 ENCOUNTER — Other Ambulatory Visit (INDEPENDENT_AMBULATORY_CARE_PROVIDER_SITE_OTHER): Payer: PRIVATE HEALTH INSURANCE

## 2014-01-12 DIAGNOSIS — E119 Type 2 diabetes mellitus without complications: Secondary | ICD-10-CM

## 2014-01-12 LAB — LDL CHOLESTEROL, DIRECT: LDL DIRECT: 122.3 mg/dL

## 2014-01-12 LAB — LIPID PANEL
CHOL/HDL RATIO: 6
Cholesterol: 205 mg/dL — ABNORMAL HIGH (ref 0–200)
HDL: 36.1 mg/dL — ABNORMAL LOW (ref >39.00)
Triglycerides: 356 mg/dL — ABNORMAL HIGH (ref 0.0–149.0)
VLDL: 71.2 mg/dL — AB (ref 0.0–40.0)

## 2014-01-12 LAB — COMPREHENSIVE METABOLIC PANEL
ALK PHOS: 54 U/L (ref 39–117)
ALT: 26 U/L (ref 0–53)
AST: 18 U/L (ref 0–37)
Albumin: 4.4 g/dL (ref 3.5–5.2)
BUN: 15 mg/dL (ref 6–23)
CO2: 27 meq/L (ref 19–32)
Calcium: 9.4 mg/dL (ref 8.4–10.5)
Chloride: 101 mEq/L (ref 96–112)
Creatinine, Ser: 1 mg/dL (ref 0.4–1.5)
GFR: 89.38 mL/min (ref >60.00)
Glucose, Bld: 157 mg/dL — ABNORMAL HIGH (ref 70–99)
Potassium: 4.1 mEq/L (ref 3.5–5.1)
SODIUM: 137 meq/L (ref 135–145)
TOTAL PROTEIN: 7.4 g/dL (ref 6.0–8.3)
Total Bilirubin: 0.7 mg/dL (ref 0.3–1.2)

## 2014-01-12 LAB — HEMOGLOBIN A1C: HEMOGLOBIN A1C: 8.2 % — AB (ref 4.6–6.5)

## 2014-01-15 ENCOUNTER — Ambulatory Visit: Payer: PRIVATE HEALTH INSURANCE | Admitting: Family Medicine

## 2014-01-19 ENCOUNTER — Ambulatory Visit (INDEPENDENT_AMBULATORY_CARE_PROVIDER_SITE_OTHER): Payer: PRIVATE HEALTH INSURANCE | Admitting: Family Medicine

## 2014-01-19 ENCOUNTER — Encounter: Payer: Self-pay | Admitting: Family Medicine

## 2014-01-19 VITALS — BP 122/70 | HR 79 | Temp 98.4°F | Wt 237.5 lb

## 2014-01-19 DIAGNOSIS — M79609 Pain in unspecified limb: Secondary | ICD-10-CM

## 2014-01-19 DIAGNOSIS — E119 Type 2 diabetes mellitus without complications: Secondary | ICD-10-CM

## 2014-01-19 DIAGNOSIS — M79643 Pain in unspecified hand: Secondary | ICD-10-CM

## 2014-01-19 DIAGNOSIS — G8929 Other chronic pain: Secondary | ICD-10-CM

## 2014-01-19 DIAGNOSIS — M549 Dorsalgia, unspecified: Secondary | ICD-10-CM

## 2014-01-19 DIAGNOSIS — R0789 Other chest pain: Secondary | ICD-10-CM

## 2014-01-19 MED ORDER — ALBUTEROL SULFATE HFA 108 (90 BASE) MCG/ACT IN AERS: 2.0000 | INHALATION_SPRAY | Freq: Four times a day (QID) | RESPIRATORY_TRACT | Status: DC | PRN

## 2014-01-19 MED ORDER — FENTANYL 75 MCG/HR TD PT72: 75.0000 ug | MEDICATED_PATCH | TRANSDERMAL | Status: DC

## 2014-01-19 MED ORDER — HYDROCODONE-ACETAMINOPHEN 7.5-325 MG PO TABS: 1.0000 | ORAL_TABLET | Freq: Three times a day (TID) | ORAL | Status: DC | PRN

## 2014-01-19 NOTE — Progress Notes (Signed)
Pre-visit discussion using our clinic review tool. No additional management support is needed unless otherwise documented below in the visit note.  Diabetes:  No meds Hypoglycemic episodes: no sx Hyperglycemic episodes: no sx Feet problems: no Blood Sugars averaging:not checked often eye exam within last year: due, encouraged.  Lost 5 lbs intentionally.  Portion control. Discussed.  He'll try to get a snack to prevent from getting over-hungry.  A1c down from 8.7 -->8.2.  Chronic pain.  UDS reviewed from 2014. On fent patch with prn vicodin. "Today is a good day. The patch helps, it's better than it was."  He isn't constipated.  No AE on meds, not sedated.  Still taking vicodin TID, essentially every day.  He'll still have days where he can't get up and get moving, up to 3 days per week. That is actually less than prev.  His early AM pain is less than prev, likely because he still has time where all medicine hasn't worn off. Discussed options re: med dosing.  Being more active has helped with his weight loss and mood.  He looks brighter today.  Still with the most pain in the L lower back and the base of his neck. He still has a sciatica type pain on the L side.  No new weakness. Still using a cane for walking.  No falls.    He still has episodic R hand pain after a fall about 1 year ago.  It was a FOOSH pain and hyperextended his R MCPs.  It will still occ flare up.  Using cane in L hand.  Still with some swelling on the R 3rd MCP. Bracing helps some.    When he has been out in cold air, he'll get chest discomfort. It doesn't happen in warm air.  He can have sx at rest if he is in cold air.  It doesn't appear to be exertional.  No GERD sx.  Wife has noted a wheeze. rolaids don't help.   PMH and SH reviewed  Meds, vitals, and allergies reviewed.   ROS: See HPI.  Otherwise negative.    GEN: nad, alert, not sedated. Speech wnl HEENT: mucous membranes moist NECK: supple w/o LA CV: rrr. PULM:  ctab, no inc wob, chest wall not ttp ABD: soft, +bs EXT: no edema SKIN: no acute rash Back ttp in lower midline and L lower paraspinal area Small amount of swelling on the R 3rd MCP w/o erythema  Diabetic foot exam: Normal inspection No skin breakdown No calluses  Normal DP pulses Normal sensation to light touch and monofilament Nails normal

## 2014-01-19 NOTE — Patient Instructions (Addendum)
Call about getting an eye exam.   I would keep using the wrist/hand brace as much as you can.  Ice your hand as needed.  Use the inhaler before cold air exposure.  See if that helps.  Increase the patch to .  When do you, start using the lower dose of hydrocodone 3 times a day.  Give me an update about 1 week after you make the dose change.  Take care.  Keep working on your weight.  Recheck in about 3 months with labs ahead of time.

## 2014-01-22 DIAGNOSIS — M79643 Pain in unspecified hand: Secondary | ICD-10-CM | POA: Insufficient documentation

## 2014-01-22 DIAGNOSIS — R0789 Other chest pain: Secondary | ICD-10-CM | POA: Insufficient documentation

## 2014-01-22 NOTE — Assessment & Plan Note (Signed)
Likely a tendonitis and he'll ice/brace this as needed.

## 2014-01-22 NOTE — Assessment & Plan Note (Signed)
Appears to be air temperature related.  He'll try SABA before exposure and he'll notify me about his sx if needed.  He agrees.

## 2014-01-22 NOTE — Assessment & Plan Note (Signed)
Some better, continue work on diet and recheck as planned later in 2015.  Labs d/w pt.

## 2014-01-22 NOTE — Assessment & Plan Note (Signed)
Treated in good faith, inc fent patch and dec vicodin strength.  He'll update in me about 1 week.  No ADE on meds.  Routine opiate cautions given.  He agrees.

## 2014-01-23 ENCOUNTER — Telehealth: Payer: Self-pay

## 2014-01-23 NOTE — Telephone Encounter (Signed)
Relevant patient education assigned to patient using Emmi. ° °

## 2014-01-26 ENCOUNTER — Other Ambulatory Visit: Payer: Self-pay | Admitting: Family Medicine

## 2014-01-30 ENCOUNTER — Other Ambulatory Visit: Payer: Self-pay | Admitting: Family Medicine

## 2014-01-30 NOTE — Telephone Encounter (Signed)
Pt [facility]xim[facility] [place]. Pt took last pill today. Pt request cb when med called in.

## 2014-01-30 NOTE — Telephone Encounter (Signed)
Pt left vm requesting refill on Treximet.  He is completely out.  He also said he would like to discuss RX for something stronger, this doesn't seem to be helping anymore.

## 2014-01-31 MED ORDER — SUMATRIPTAN-NAPROXEN SODIUM 85-500 MG PO TABS: 1.0000 | ORAL_TABLET | ORAL | Status: DC | PRN

## 2014-01-31 NOTE — Telephone Encounter (Signed)
Left message on voice mail  to call back

## 2014-01-31 NOTE — Telephone Encounter (Signed)
Left message on voicemail to call back, 

## 2014-01-31 NOTE — Telephone Encounter (Signed)
Sent!

## 2014-01-31 NOTE — Telephone Encounter (Signed)
Patient notified by telephone that script has been sent to the pharmacy per Dr. Duncan. 

## 2014-02-12 ENCOUNTER — Other Ambulatory Visit: Payer: Self-pay

## 2014-02-12 MED ORDER — FENTANYL 75 MCG/HR TD PT72: 75.0000 ug | MEDICATED_PATCH | TRANSDERMAL | Status: DC

## 2014-02-12 NOTE — Telephone Encounter (Signed)
Pt left v/m requesting rx fentanyl 75 mcg patch; pt has 2 patches left. Call when ready for pick up.

## 2014-02-12 NOTE — Telephone Encounter (Signed)
Printed.  Thanks.  

## 2014-02-13 NOTE — Telephone Encounter (Signed)
Patient notified that script is up front and ready for pickup. 

## 2014-02-19 ENCOUNTER — Telehealth: Payer: Self-pay

## 2014-02-19 ENCOUNTER — Other Ambulatory Visit: Payer: Self-pay

## 2014-02-19 NOTE — Telephone Encounter (Signed)
Never heard of Coquil.  He may be talking about coQ10.  It would be okay to take the OTC form, but it may not be of use.  We can refill the fentanyl early.  He'll have to make due with his norco in the meantime.  This is unfortunate, but I can't fill this early.  This isn't my preference. This is the clinic policy.

## 2014-02-19 NOTE — Telephone Encounter (Signed)
Pt left v/m; pt has gone home and looked for fentanyl rx and cannot find rx and pt will be out of med in AM. Pt said no one else has been in his home so he does not think rx was stolen; pt is not sure where rx is. Advised pt because of control level of med important to keep up with rx. Pt voiced understanding but request another fentanyl rx. Pt request cb.

## 2014-02-19 NOTE — Telephone Encounter (Signed)
Pt cannot find fentanyl prescription; pt will search at home tonight for prescription and will cb if cannot find rx. Pt also wants to know if Coquil (? Some type of vitamin or supplement) would be beneficial for pt to take. Pt said a friend told him about Coquil would be good for pt to take but wants OKed by Dr [name] before pt tries med. Pt request cb about Coquil.

## 2014-02-20 NOTE — Telephone Encounter (Signed)
Pt called to ck on status of Fentanyl. Pt notified as instructed. Pt wants to know how soon he can get the fentanyl patch; pt is upset not having a good day. Pt request cb.

## 2014-02-20 NOTE — Telephone Encounter (Signed)
03/16/14, when the prev rx would have run out.

## 2014-02-20 NOTE — Telephone Encounter (Signed)
Noted, thanks, I am very happy about that.

## 2014-02-20 NOTE — Telephone Encounter (Signed)
Pt called back; pt had not told his wife he could not find rx for fentanyl until this afternoon; pts wife had put fentanyl rx up so it would not get lost. Pt has fentanyl rx and will get it filled. Pt is appreciative for our concern and help.

## 2014-02-22 ENCOUNTER — Other Ambulatory Visit: Payer: Self-pay

## 2014-02-22 MED ORDER — HYDROCODONE-ACETAMINOPHEN 7.5-325 MG PO TABS: 1.0000 | ORAL_TABLET | Freq: Three times a day (TID) | ORAL | Status: DC | PRN

## 2014-02-22 MED ORDER — METHOCARBAMOL 500 MG PO TABS: ORAL_TABLET | ORAL | Status: DC

## 2014-02-22 NOTE — Telephone Encounter (Signed)
Pt request rx hydrocodone apap and methocarbamol. Pt needs to pick up on 02/23/14. Call when ready for pick up.

## 2014-02-22 NOTE — Telephone Encounter (Signed)
Printed, thanks

## 2014-02-23 NOTE — Telephone Encounter (Signed)
Error

## 2014-02-23 NOTE — Telephone Encounter (Signed)
Pt checking on status of hydrocodone apap and methocarbamol rx. Advised pt rx at front desk for pick up.

## 2014-03-02 ENCOUNTER — Other Ambulatory Visit: Payer: Self-pay

## 2014-03-02 MED ORDER — MONTELUKAST SODIUM 10 MG PO TABS
10.0000 mg | ORAL_TABLET | Freq: Every day | ORAL | Status: DC
Start: 2014-03-02 — End: 2014-07-11

## 2014-03-02 NOTE — Telephone Encounter (Signed)
Pharmacist at [facility] pharmacy request cb because pt wanted to pickup singulair refill today. Spoke with Almeta Monashase at pharmacy and called in refill.

## 2014-03-09 ENCOUNTER — Other Ambulatory Visit: Payer: Self-pay | Admitting: *Deleted

## 2014-03-09 MED ORDER — FLUTICASONE PROPIONATE 50 MCG/ACT NA SUSP: NASAL | Status: DC

## 2014-03-13 ENCOUNTER — Other Ambulatory Visit: Payer: Self-pay

## 2014-03-13 NOTE — Telephone Encounter (Signed)
These are all too early.

## 2014-03-13 NOTE — Telephone Encounter (Signed)
Pt left v/m requesting rx on fentanyl, hydrocodone apap and methocarbamol. Call when ready for pick up.

## 2014-03-13 NOTE — Telephone Encounter (Signed)
Tried to phone patient to let him know this request is too early.  No answer and VM box is full.

## 2014-03-16 ENCOUNTER — Other Ambulatory Visit: Payer: Self-pay

## 2014-03-16 MED ORDER — FENTANYL 75 MCG/HR TD PT72: 75.0000 ug | MEDICATED_PATCH | TRANSDERMAL | Status: DC

## 2014-03-16 MED ORDER — METHOCARBAMOL 500 MG PO TABS: ORAL_TABLET | ORAL | Status: DC

## 2014-03-16 MED ORDER — HYDROCODONE-ACETAMINOPHEN 7.5-325 MG PO TABS: 1.0000 | ORAL_TABLET | Freq: Three times a day (TID) | ORAL | Status: DC | PRN

## 2014-03-16 NOTE — Telephone Encounter (Signed)
Printed.  Thanks.  

## 2014-03-16 NOTE — Telephone Encounter (Signed)
Pt has one patch of fentanyl to put on 03/17/14; pt request refill on fentanyl, hydrocodone apap, methocarbamol. Pt would like to pick up all 3 rx at same time and pt understands hydrocodone and methocarbamol is early but pt said he needs time to get prescription picked up,taken to pharmacy and all that takes time. Pt request cb when ready for pick up.

## 2014-03-16 NOTE — Telephone Encounter (Signed)
Patient advised.  Rx left at front desk for pick up. 

## 2014-04-02 ENCOUNTER — Other Ambulatory Visit: Payer: Self-pay

## 2014-04-02 MED ORDER — ALBUTEROL SULFATE HFA 108 (90 BASE) MCG/ACT IN AERS: 2.0000 | INHALATION_SPRAY | Freq: Four times a day (QID) | RESPIRATORY_TRACT | Status: DC | PRN

## 2014-04-02 NOTE — Telephone Encounter (Signed)
Pt request refill proventil HFA to [facility] as requested. Pt notified done.

## 2014-04-12 ENCOUNTER — Other Ambulatory Visit (INDEPENDENT_AMBULATORY_CARE_PROVIDER_SITE_OTHER): Payer: PRIVATE HEALTH INSURANCE

## 2014-04-12 DIAGNOSIS — E119 Type 2 diabetes mellitus without complications: Secondary | ICD-10-CM

## 2014-04-12 LAB — HEMOGLOBIN A1C: Hgb A1c MFr Bld: 7.6 % — ABNORMAL HIGH (ref 4.6–6.5)

## 2014-04-17 ENCOUNTER — Other Ambulatory Visit: Payer: Self-pay

## 2014-04-17 NOTE — Telephone Encounter (Signed)
Pt left v/m requesting rx fentanyl, Hydrocodone apap and methocarbamol.call when ready for pick up. Pt knows that hydrocodone and methocarbamol is slightly early but wants to pick up all rx at same time. Pt also changed his 3 mth f/u until 04/23/14.

## 2014-04-18 MED ORDER — FENTANYL 75 MCG/HR TD PT72: 75.0000 ug | MEDICATED_PATCH | TRANSDERMAL | Status: DC

## 2014-04-18 MED ORDER — HYDROCODONE-ACETAMINOPHEN 7.5-325 MG PO TABS: 1.0000 | ORAL_TABLET | Freq: Three times a day (TID) | ORAL | Status: DC | PRN

## 2014-04-18 MED ORDER — METHOCARBAMOL 500 MG PO TABS: ORAL_TABLET | ORAL | Status: DC

## 2014-04-18 NOTE — Telephone Encounter (Signed)
Pt left v/m requesting status of picking up rxs. Pt just applied last fentanyl patch.

## 2014-04-18 NOTE — Telephone Encounter (Signed)
Pt called back cking on status of rxs. Advised pt rx at front desk for pick up.

## 2014-04-18 NOTE — Telephone Encounter (Signed)
Printed

## 2014-04-19 ENCOUNTER — Ambulatory Visit: Payer: PRIVATE HEALTH INSURANCE | Admitting: Family Medicine

## 2014-04-23 ENCOUNTER — Ambulatory Visit (INDEPENDENT_AMBULATORY_CARE_PROVIDER_SITE_OTHER): Payer: PRIVATE HEALTH INSURANCE | Admitting: Family Medicine

## 2014-04-23 ENCOUNTER — Encounter: Payer: Self-pay | Admitting: Family Medicine

## 2014-04-23 VITALS — BP 146/80 | HR 73 | Temp 98.4°F | Wt 234.2 lb

## 2014-04-23 DIAGNOSIS — G8929 Other chronic pain: Secondary | ICD-10-CM

## 2014-04-23 DIAGNOSIS — M549 Dorsalgia, unspecified: Secondary | ICD-10-CM

## 2014-04-23 DIAGNOSIS — E119 Type 2 diabetes mellitus without complications: Secondary | ICD-10-CM

## 2014-04-23 DIAGNOSIS — Z23 Encounter for immunization: Secondary | ICD-10-CM

## 2014-04-23 NOTE — Patient Instructions (Signed)
Recheck in about 4 months.  We can do the A1c at the visit if you prefer.  Take care. Don't change your meds for now.  Keep working on your weight.   Glad to see you.

## 2014-04-23 NOTE — Progress Notes (Signed)
Pre visit review using our clinic review tool, if applicable. No additional management support is needed unless otherwise documented below in the visit note.  Diabetes:  Using medications without difficulties: no meds Hypoglycemic episodes:no sx Hyperglycemic episodes:no sx Feet problems:no Blood Sugars averaging: note checked.   A1c improved with weight loss.   Back pain, continues in L lower back and lower midline.  No new sx.  Continues to have "manageable" with the fentanyl patch and prn hydrocodone.  The patch tends to wear off on the 3rd day, but he is still better off functionally than prev.  No ADE on meds. Normal BMs. Not sedated.  Still with spasms in the lower back.  Pain worse with weather changes.    Meds, vitals, and allergies reviewed.   ROS: See HPI.  Otherwise negative.    GEN: nad, alert and oriented HEENT: mucous membranes moist NECK: supple w/o LA CV: rrr. PULM: ctab, no inc wob ABD: soft, +bs EXT: no edema SKIN: no acute rash Lower back ttp in midline and L lower back.  Distally able to bear weight, walking with cane for stability.  Spends part of the interview leaning over the exam table, flexed at the waist as this helps with pain.  fent patch on R shoulder.
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
## 2014-04-24 NOTE — Assessment & Plan Note (Signed)
Improved, continue weight loss, no new meds today.

## 2014-05-11 ENCOUNTER — Encounter: Payer: Self-pay | Admitting: Family Medicine

## 2014-05-15 ENCOUNTER — Other Ambulatory Visit: Payer: Self-pay

## 2014-05-15 MED ORDER — LISINOPRIL 10 MG PO TABS: 10.0000 mg | ORAL_TABLET | Freq: Every day | ORAL | Status: DC

## 2014-05-15 NOTE — Telephone Encounter (Signed)
Pt request rx fentanyl, hydrocodone apap and methocarbamol. Call when ready for pick up.pt also request refill gabapentin, lisinopril and donepezil to [facility] pharmacy in [place]. Lisinopril done.

## 2014-05-16 MED ORDER — HYDROCODONE-ACETAMINOPHEN 7.5-325 MG PO TABS: 1.0000 | ORAL_TABLET | Freq: Three times a day (TID) | ORAL | Status: DC | PRN

## 2014-05-16 MED ORDER — METHOCARBAMOL 500 MG PO TABS: ORAL_TABLET | ORAL | Status: DC

## 2014-05-16 MED ORDER — FENTANYL 75 MCG/HR TD PT72: 75.0000 ug | MEDICATED_PATCH | TRANSDERMAL | Status: DC

## 2014-05-16 MED ORDER — DONEPEZIL HCL 10 MG PO TABS: 10.0000 mg | ORAL_TABLET | Freq: Every day | ORAL | Status: DC

## 2014-05-16 MED ORDER — GABAPENTIN 300 MG PO CAPS: ORAL_CAPSULE | ORAL | Status: DC

## 2014-05-16 NOTE — Telephone Encounter (Signed)
Printed/sent, I'll sign when I get to clinic.  Thanks .

## 2014-05-16 NOTE — Telephone Encounter (Signed)
Patient notified by telephone that scripts are up front ready for pickup. 

## 2014-05-18 ENCOUNTER — Other Ambulatory Visit: Payer: Self-pay | Admitting: *Deleted

## 2014-06-11 ENCOUNTER — Other Ambulatory Visit: Payer: Self-pay

## 2014-06-11 NOTE — Telephone Encounter (Signed)
Pt left v/m requesting rx fentanyl, hydrocodone apap and methocarbamol. Call when ready for pick up. Pt knows that rx on 2 of meds are early but pt will not get filled until appropriate date, pt does not want to make 2 trips to office for pick up of rxs.Please advise.

## 2014-06-12 MED ORDER — METHOCARBAMOL 500 MG PO TABS: ORAL_TABLET | ORAL | Status: DC

## 2014-06-12 MED ORDER — FENTANYL 75 MCG/HR TD PT72: 75.0000 ug | MEDICATED_PATCH | TRANSDERMAL | Status: DC

## 2014-06-12 MED ORDER — HYDROCODONE-ACETAMINOPHEN 7.5-325 MG PO TABS: 1.0000 | ORAL_TABLET | Freq: Three times a day (TID) | ORAL | Status: DC | PRN

## 2014-06-12 NOTE — Telephone Encounter (Signed)
Pt notified methocarbamol sent to pharmacy and other two Rxs are ready for pick-up

## 2014-06-12 NOTE — Telephone Encounter (Signed)
Methocarbamol sent, the others are printed.  Thanks.

## 2014-06-14 ENCOUNTER — Telehealth: Payer: Self-pay | Admitting: Family Medicine

## 2014-06-14 ENCOUNTER — Other Ambulatory Visit: Payer: Self-pay | Admitting: *Deleted

## 2014-06-14 MED ORDER — METHOCARBAMOL 500 MG PO TABS: ORAL_TABLET | ORAL | Status: DC

## 2014-06-14 NOTE — Telephone Encounter (Signed)
Rx was semt earlier this week to wrong pharmacy.  Correction made to [facility].

## 2014-06-14 NOTE — Telephone Encounter (Signed)
Open in error

## 2014-07-11 ENCOUNTER — Other Ambulatory Visit: Payer: Self-pay

## 2014-07-11 ENCOUNTER — Other Ambulatory Visit: Payer: Self-pay | Admitting: Family Medicine

## 2014-07-11 NOTE — Telephone Encounter (Signed)
Pt request rx for fentanyl patch,hydrocodone apap and methocarbamol. Call when ready for pick up.

## 2014-07-12 MED ORDER — FENTANYL 75 MCG/HR TD PT72: 75.0000 ug | MEDICATED_PATCH | TRANSDERMAL | Status: DC

## 2014-07-12 MED ORDER — METHOCARBAMOL 500 MG PO TABS
ORAL_TABLET | ORAL | Status: DC
Start: 2014-07-12 — End: 2014-08-10

## 2014-07-12 MED ORDER — HYDROCODONE-ACETAMINOPHEN 7.5-325 MG PO TABS: 1.0000 | ORAL_TABLET | Freq: Three times a day (TID) | ORAL | Status: DC | PRN

## 2014-07-12 MED ORDER — METHOCARBAMOL 500 MG PO TABS: ORAL_TABLET | ORAL | Status: DC

## 2014-07-12 NOTE — Telephone Encounter (Signed)
Left v/m that rxs are ready for pick up at front desk. Dee, Dr Armanda Heritageuncans CMA this morning will shred the old rxs.

## 2014-07-12 NOTE — Telephone Encounter (Signed)
Printed.  Thanks.  

## 2014-07-12 NOTE — Telephone Encounter (Signed)
Reprinted, thanks.  Shred the old ones.  See new fill dates on rxs.

## 2014-07-12 NOTE — Telephone Encounter (Signed)
Pt left v/m;pt spoke to pharmacy and insurance will allow pt to fill rx today or tomorrow and pt does not have enough med to last thru the weekend. Pt request cb ASAP. July 23,2015 is 30 days. Rx said to fill rx on or after 07/15/14.Please advise.

## 2014-07-12 NOTE — Telephone Encounter (Signed)
[facility] and pt last picked up Fentanyl and Hydrocodone on 06/15/14. Ins will allow to be filled 2 days early so pt could get rx on 07/13/14. So July 15, 2014 is 30 days. Last month prescription was printed on 06/12/14 but pt did not get filled until 06/15/14. So rx was written correctly to say fill on or after 07/15/14. Spoke with pt and he said he would be out of med on 07/14/14.Please advise.

## 2014-07-25 ENCOUNTER — Ambulatory Visit (INDEPENDENT_AMBULATORY_CARE_PROVIDER_SITE_OTHER): Payer: PRIVATE HEALTH INSURANCE | Admitting: Family Medicine

## 2014-07-25 ENCOUNTER — Encounter: Payer: Self-pay | Admitting: Family Medicine

## 2014-07-25 VITALS — BP 148/84 | HR 80 | Temp 98.0°F | Wt 233.2 lb

## 2014-07-25 DIAGNOSIS — R413 Other amnesia: Secondary | ICD-10-CM

## 2014-07-25 DIAGNOSIS — E119 Type 2 diabetes mellitus without complications: Secondary | ICD-10-CM

## 2014-07-25 DIAGNOSIS — M549 Dorsalgia, unspecified: Secondary | ICD-10-CM

## 2014-07-25 DIAGNOSIS — G8929 Other chronic pain: Secondary | ICD-10-CM

## 2014-07-25 MED ORDER — FENTANYL 75 MCG/HR TD PT72: 75.0000 ug | MEDICATED_PATCH | TRANSDERMAL | Status: DC
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
## 2014-07-25 NOTE — Patient Instructions (Signed)
Patrick Ochoa will call about your referral to neuro and about the CT.  We'll contact you with your lab report. Change the patches every other day.  Talk to the pharmacy in the meantime.  They/we may need to work on a prior approval.  Take care.  Glad to see you.

## 2014-07-26 ENCOUNTER — Encounter: Payer: Self-pay | Admitting: Family Medicine

## 2014-07-26 LAB — COMPREHENSIVE METABOLIC PANEL
ALT: 22 U/L (ref 0–53)
AST: 21 U/L (ref 0–37)
Albumin: 4.4 g/dL (ref 3.5–5.2)
Alkaline Phosphatase: 58 U/L (ref 39–117)
BILIRUBIN TOTAL: 0.6 mg/dL (ref 0.2–1.2)
BUN: 14 mg/dL (ref 6–23)
CO2: 28 mEq/L (ref 19–32)
CREATININE: 0.8 mg/dL (ref 0.4–1.5)
Calcium: 9.6 mg/dL (ref 8.4–10.5)
Chloride: 100 mEq/L (ref 96–112)
GFR: 105.69 mL/min (ref >60.00)
Glucose, Bld: 203 mg/dL — ABNORMAL HIGH (ref 70–99)
POTASSIUM: 4 meq/L (ref 3.5–5.1)
Sodium: 136 mEq/L (ref 135–145)
Total Protein: 7.5 g/dL (ref 6.0–8.3)

## 2014-07-26 LAB — HEMOGLOBIN A1C: Hgb A1c MFr Bld: 7.4 % — ABNORMAL HIGH (ref 4.6–6.5)

## 2014-07-26 LAB — VITAMIN B12

## 2014-07-26 LAB — TSH: TSH: 0.45 u[IU]/mL (ref 0.35–4.50)

## 2014-07-26 NOTE — Assessment & Plan Note (Signed)
The only change I would make at this point is the q2 day change of fent patch.  He clearly decompensates from a pain standpoint every 3rd day.  He has been injected prev w/o relief and isn't noted to have a surgical target.  No compliance issues with meds.

## 2014-07-26 NOTE — Assessment & Plan Note (Signed)
Worsening, no change in meds. He had tolerated current meds prev w/o worsening mentation.  He continues to CPAP, compliant. Unclear how much is due to pain vs worsening from the initial insult.  Continue current meds for now, check basic labs and head CT.  D/w pt.  Would refer back to neuro for input. He agrees. >50 minutes spent in face to face time with patient, >50% spent in counselling or coordination of care

## 2014-07-26 NOTE — Assessment & Plan Note (Signed)
See notes on labs. 

## 2014-07-27 ENCOUNTER — Other Ambulatory Visit: Payer: Self-pay | Admitting: Family Medicine

## 2014-07-27 ENCOUNTER — Encounter: Payer: Self-pay | Admitting: *Deleted

## 2014-07-27 DIAGNOSIS — E538 Deficiency of other specified B group vitamins: Secondary | ICD-10-CM

## 2014-07-27 DIAGNOSIS — E119 Type 2 diabetes mellitus without complications: Secondary | ICD-10-CM

## 2014-08-01 ENCOUNTER — Ambulatory Visit (INDEPENDENT_AMBULATORY_CARE_PROVIDER_SITE_OTHER)
Admission: RE | Admit: 2014-08-01 | Discharge: 2014-08-01 | Disposition: A | Discharge status: Home / Self Care | Payer: PRIVATE HEALTH INSURANCE | Source: Ambulatory Visit | Attending: Family Medicine | Admitting: Family Medicine

## 2014-08-01 DIAGNOSIS — R413 Other amnesia: Secondary | ICD-10-CM

## 2014-08-08 ENCOUNTER — Ambulatory Visit (INDEPENDENT_AMBULATORY_CARE_PROVIDER_SITE_OTHER): Payer: PRIVATE HEALTH INSURANCE | Admitting: Neurology

## 2014-08-08 ENCOUNTER — Encounter: Payer: Self-pay | Admitting: Neurology

## 2014-08-08 VITALS — BP 119/71 | HR 62 | Resp 16 | Ht 69.5 in | Wt 232.0 lb

## 2014-08-08 DIAGNOSIS — G4733 Obstructive sleep apnea (adult) (pediatric): Secondary | ICD-10-CM

## 2014-08-08 DIAGNOSIS — IMO0001 Reserved for inherently not codable concepts without codable children: Secondary | ICD-10-CM

## 2014-08-08 DIAGNOSIS — R404 Transient alteration of awareness: Secondary | ICD-10-CM

## 2014-08-08 DIAGNOSIS — R413 Other amnesia: Secondary | ICD-10-CM | POA: Insufficient documentation

## 2014-08-08 DIAGNOSIS — Z9989 Dependence on other enabling machines and devices: Secondary | ICD-10-CM

## 2014-08-08 DIAGNOSIS — G894 Chronic pain syndrome: Secondary | ICD-10-CM | POA: Insufficient documentation
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
## 2014-08-08 NOTE — Patient Instructions (Signed)
Mild Neurocognitive Disorder  Mild neurocognitive disorder (formerly known as mild cognitive impairment) is a mental disorder. It is a slight abnormal decrease in mental function. The areas of mental function affected may include memory, thought, communication, behavior, and completion of tasks. The decrease is noticeable and measurable but for the most part does not interfere with your daily activities.  Mild neurocognitive disorder typically occurs in people older than 60 years but can occur earlier. It is not as serious as major neurocognitive disorder (formerly known as dementia) but may lead to a more serious neurocognitive disorder. However, in some cases the condition does not get worse. A few people with this disorder even improve.  CAUSES   There are a number of different causes of mild neurocognitive disorder:   · Brain disorders associated with abnormal protein deposits, such as Alzheimer's disease, Pick's disease, and Lewy body disease.  · Brain disorders associated with abnormal movement, such as Parkinson's disease and Huntington's disease.  · Diseases affecting blood vessels in the brain and resulting in mini-strokes.  · Certain infections, such as human immunodeficiency virus (HIV) infection.  · Traumatic brain injury.  · Other medical conditions such as brain tumors, underactive thyroid (hypothyroidism), and vitamin B12 deficiency.  · Use of certain prescription medicine and "recreational" drugs.  SYMPTOMS   Symptoms of mild neurocognitive disorder include:  · Difficulty remembering. You may forget details of recent events, names, or phone numbers. You may forget important social events and appointments or repeatedly forget where you put your car keys.   · Difficulty thinking and solving problems. You may have trouble with complex tasks such as paying bills or driving in unfamiliar locations.   · Difficulty communicating. You may have trouble finding the right word, naming an object, forming a  sentence that makes sense, or understanding what you read or hear.  · Changes in your behavior or personality. You may lose interest in the things that you used to enjoy or withdraw from social situations. You may get angry more easily than usual. You may act before thinking. You may do things in public that you would not usually do. You may hear or see things that are not real (hallucinations). You may believe falsely that others are trying to hurt you (paranoia).  DIAGNOSIS  Mild neurocognitive disorder is diagnosed through an assessment by your health care provider. Your health care provider will ask you and your family, friends, or coworkers questions about your symptoms. He or she will ask how often the symptoms occur, how long they have been occurring, whether they are getting worse, and the effect they are having on your life. Your health care provider may refer you to a neurologist or mental health specialist for a detailed evaluation of your mental functions (neuropsychological testing).   To identify the cause of your mild neurocognitive disorder, your health care provider may:  · Obtain a detailed medical history.  · Ask about alcohol and drug use, including prescription medicine.  · Perform a physical exam.  · Order blood tests and brain imaging exams.  TREATMENT   Mild neurocognitive disorder caused by infections, use of certain medicines or "recreational" drugs, and certain medical conditions may improve with treatment of the condition that is causing the disorder.  Mild neurocognitive disorder resulting from other causes generally does not improve and may worsen. In these cases, the goal of treatment is to slow progression of the disorder and help you cope with the loss of mental function. Treatments in these cases   include:   · Medicine. Medicine helps mainly with memory loss and behavioral symptoms.    · Talk therapy. Talk therapy provides education, emotional support, memory aids, and other ways of  making up for decreases in mental function.       · Lifestyle changes. These include regular exercise, a healthy diet (including essential omega-3 fatty acids), intellectual stimulation, and increased social interaction.  Document Released: 08/09/2013 Document Revised: 04/23/2014 Document Reviewed: 08/09/2013  Exit[employer]® Patient Information ©2015 [employer]. This information is not intended to replace advice given to you by your health care provider. Make sure you discuss any questions you have with your health care provider.

## 2014-08-10 ENCOUNTER — Other Ambulatory Visit: Payer: Self-pay

## 2014-08-10 MED ORDER — METHOCARBAMOL 500 MG PO TABS: ORAL_TABLET | ORAL | Status: DC

## 2014-08-10 MED ORDER — HYDROCODONE-ACETAMINOPHEN 7.5-325 MG PO TABS: 1.0000 | ORAL_TABLET | Freq: Three times a day (TID) | ORAL | Status: DC | PRN

## 2014-08-10 NOTE — Telephone Encounter (Signed)
Printed.  Thanks.  

## 2014-08-10 NOTE — Telephone Encounter (Signed)
Pt request rx hydrocodone apap and methocarbamol. Call when ready for pick up . Pt will be out of med today and [facility] is not open on Sun 08/12/14. Pt said has taken occasionally more than 3 hydrocodone a day due to severe pain; pt hopeful since fentanyl has been changed to every other day that pt will not need to take extra hydrocodone. Pt request to pick up rx today and be able to get filled today.Please advise.

## 2014-08-10 NOTE — Telephone Encounter (Signed)
Patient advised.  Rx left at front desk for pick up. 

## 2014-08-14 ENCOUNTER — Ambulatory Visit (INDEPENDENT_AMBULATORY_CARE_PROVIDER_SITE_OTHER): Payer: PRIVATE HEALTH INSURANCE | Admitting: Radiology

## 2014-08-14 DIAGNOSIS — R404 Transient alteration of awareness: Secondary | ICD-10-CM

## 2014-08-14 DIAGNOSIS — G4733 Obstructive sleep apnea (adult) (pediatric): Secondary | ICD-10-CM

## 2014-08-14 DIAGNOSIS — IMO0001 Reserved for inherently not codable concepts without codable children: Secondary | ICD-10-CM

## 2014-08-14 DIAGNOSIS — Z9989 Dependence on other enabling machines and devices: Secondary | ICD-10-CM

## 2014-08-14 DIAGNOSIS — G894 Chronic pain syndrome: Secondary | ICD-10-CM

## 2014-08-14 DIAGNOSIS — R413 Other amnesia: Secondary | ICD-10-CM

## 2014-08-21 ENCOUNTER — Telehealth: Payer: Self-pay | Admitting: Family Medicine

## 2014-08-21 NOTE — Telephone Encounter (Signed)
Pt cancelled appointment for 08/24/14 because he was just seen in the beginning of August. Pt states since he was just seen he felt this appointment was too soon. When does pt need to make a follow up appt?

## 2014-08-22 NOTE — Telephone Encounter (Signed)
Reasonable to cancel for now.  I saw that he had eval with neuro.  I'm still awaiting the results per neuro: repeat brain imaging MRI, Repeat EEG , and the consult note from Dr . Leonides Cave for neuropsychological testing.  Thanks.

## 2014-08-22 NOTE — Telephone Encounter (Signed)
Patient notified as instructed by telephone. Patient stated that he will make sure that Dr. Leonides Cave sends all of the results over to Dr. Para March.

## 2014-08-24 ENCOUNTER — Ambulatory Visit: Payer: PRIVATE HEALTH INSURANCE | Admitting: Family Medicine

## 2014-09-04 ENCOUNTER — Other Ambulatory Visit: Payer: Self-pay

## 2014-09-04 NOTE — Telephone Encounter (Signed)
Pt left message requesting cb. Left v/m requesting pt to cb.

## 2014-09-04 NOTE — Telephone Encounter (Signed)
Pt left v/m requesting rx fentanyl, hydrocodone apap and menthocarbamol. Call when ready for pick up.

## 2014-09-05 MED ORDER — FENTANYL 75 MCG/HR TD PT72: 75.0000 ug | MEDICATED_PATCH | TRANSDERMAL | Status: DC

## 2014-09-05 MED ORDER — METHOCARBAMOL 500 MG PO TABS
ORAL_TABLET | ORAL | Status: DC
Start: 2014-09-05 — End: 2014-10-02

## 2014-09-05 MED ORDER — HYDROCODONE-ACETAMINOPHEN 7.5-325 MG PO TABS: 1.0000 | ORAL_TABLET | Freq: Three times a day (TID) | ORAL | Status: DC | PRN

## 2014-09-05 NOTE — Telephone Encounter (Signed)
Printed.  Thanks.  

## 2014-09-05 NOTE — Telephone Encounter (Signed)
Detailed message left on voicemail that script is up front ready for pickup. 

## 2014-09-07 DIAGNOSIS — Z9989 Dependence on other enabling machines and devices: Secondary | ICD-10-CM

## 2014-09-07 DIAGNOSIS — IMO0001 Reserved for inherently not codable concepts without codable children: Secondary | ICD-10-CM | POA: Insufficient documentation

## 2014-09-07 DIAGNOSIS — G4733 Obstructive sleep apnea (adult) (pediatric): Secondary | ICD-10-CM | POA: Insufficient documentation
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
## 2014-09-19 ENCOUNTER — Telehealth: Payer: Self-pay | Admitting: *Deleted

## 2014-09-19 NOTE — Telephone Encounter (Signed)
Left voicemail normal EEG results.

## 2014-09-19 NOTE — Telephone Encounter (Signed)
Spoke with patient in regards to referral to Dr. Leonides CaveZelson for neurophys testing. Explained to him the purpose of referral. He will take care of it he was confused about purpose of referal.

## 2014-09-19 NOTE — Telephone Encounter (Signed)
Please see results under EEG - they are read by me and you can quote me. CD

## 2014-09-19 NOTE — Telephone Encounter (Signed)
Patient did not receive results of EEG done 08/14/14. Please respond.

## 2014-10-02 ENCOUNTER — Other Ambulatory Visit: Payer: Self-pay

## 2014-10-02 NOTE — Telephone Encounter (Signed)
Pt left v/m requesting rx fentanyl patch, hydrocodone apap and methocarbamol. Pt would like to fill on 10/05/14. Pt request cb. Pt notified call received.

## 2014-10-03 MED ORDER — METHOCARBAMOL 500 MG PO TABS: ORAL_TABLET | ORAL | Status: DC

## 2014-10-03 MED ORDER — FENTANYL 75 MCG/HR TD PT72: 75.0000 ug | MEDICATED_PATCH | TRANSDERMAL | Status: DC

## 2014-10-03 MED ORDER — HYDROCODONE-ACETAMINOPHEN 7.5-325 MG PO TABS: 1.0000 | ORAL_TABLET | Freq: Three times a day (TID) | ORAL | Status: DC | PRN

## 2014-10-03 NOTE — Telephone Encounter (Signed)
Printed, please notify pt when I sign.  Thanks.

## 2014-10-03 NOTE — Telephone Encounter (Signed)
Patient notified by telephone that script is up front ready for pickup. 

## 2014-10-31 ENCOUNTER — Other Ambulatory Visit: Payer: Self-pay

## 2014-10-31 MED ORDER — METHOCARBAMOL 500 MG PO TABS: ORAL_TABLET | ORAL | Status: DC

## 2014-10-31 MED ORDER — HYDROCODONE-ACETAMINOPHEN 7.5-325 MG PO TABS: 1.0000 | ORAL_TABLET | Freq: Three times a day (TID) | ORAL | Status: DC | PRN

## 2014-10-31 MED ORDER — FENTANYL 75 MCG/HR TD PT72: 75.0000 ug | MEDICATED_PATCH | TRANSDERMAL | Status: DC

## 2014-10-31 NOTE — Telephone Encounter (Signed)
Pt left v/m requesting rx hydrocodone apap,fentanyl patch and methocarbamol. Call when ready for pick up. Pt has 3 more fentanyl patches and would like all rx printed so pt can get filled sameday.

## 2014-10-31 NOTE — Telephone Encounter (Signed)
Printed.  Thanks.  

## 2014-11-01 NOTE — Telephone Encounter (Signed)
Patient notified by telephone that script is up front ready for pickup. 

## 2014-11-12 ENCOUNTER — Other Ambulatory Visit: Payer: Self-pay | Admitting: Family Medicine

## 2014-11-12 NOTE — Telephone Encounter (Signed)
Electronic refill request.  Last Filled:   360 capsule 5 RF  On 05/16/14  Please advise.

## 2014-11-13 NOTE — Telephone Encounter (Signed)
Sent. Thanks.   

## 2014-11-29 ENCOUNTER — Other Ambulatory Visit: Payer: Self-pay

## 2014-11-29 MED ORDER — FENTANYL 75 MCG/HR TD PT72: 75.0000 ug | MEDICATED_PATCH | TRANSDERMAL | Status: DC

## 2014-11-29 MED ORDER — HYDROCODONE-ACETAMINOPHEN 7.5-325 MG PO TABS: 1.0000 | ORAL_TABLET | Freq: Three times a day (TID) | ORAL | Status: DC | PRN

## 2014-11-29 MED ORDER — METHOCARBAMOL 500 MG PO TABS: ORAL_TABLET | ORAL | Status: DC

## 2014-11-29 NOTE — Telephone Encounter (Signed)
Pt left v/m requesting rx for fentanyl,hydrocodone apap and methocarbamol. Call when ready for pick up. Pt last seen 07/25/14. Pt request to pick up rx on 11/30/14. Pt has 3 fentanyl patches left and changes them everyother day.

## 2014-11-29 NOTE — Telephone Encounter (Signed)
Printed.  Thanks.  

## 2014-11-29 NOTE — Telephone Encounter (Signed)
Left detailed message on voicemail. Rx left at front desk for pick up.  

## 2014-12-31 ENCOUNTER — Other Ambulatory Visit: Payer: Self-pay

## 2014-12-31 MED ORDER — METHOCARBAMOL 500 MG PO TABS: ORAL_TABLET | ORAL | Status: DC

## 2014-12-31 MED ORDER — FENTANYL 75 MCG/HR TD PT72: 75.0000 ug | MEDICATED_PATCH | TRANSDERMAL | Status: DC

## 2014-12-31 MED ORDER — HYDROCODONE-ACETAMINOPHEN 7.5-325 MG PO TABS: 1.0000 | ORAL_TABLET | Freq: Three times a day (TID) | ORAL | Status: DC | PRN

## 2014-12-31 NOTE — Telephone Encounter (Signed)
Pt called and request status of rx; advised pt per phone note rx at front desk ready for pick up. Pt having problems with phone and did not get message but will come to pick up rx.

## 2014-12-31 NOTE — Telephone Encounter (Signed)
Pt would like these Rx to go [facility]

## 2014-12-31 NOTE — Telephone Encounter (Signed)
Left detailed message on voicemail. Rx left at front desk for pick up.  

## 2014-12-31 NOTE — Telephone Encounter (Signed)
Printed. Needs OV to discuss pain meds and options.  visit.  Thanks.

## 2014-12-31 NOTE — Telephone Encounter (Signed)
Pt requesting rx hydrocodone apap, methocarbamol and fentanyl patch. Call when ready for pick up. Pt was sick last week and forgot to order pain med. Pt is out of hydrocodone apap and methocarbamol and has 2 patches of fentanyl. Pt request to pick up rx today. Last couple of months pt is not getting as much pain relief when takes hydrocodone apap.

## 2015-01-07 ENCOUNTER — Ambulatory Visit: Payer: PRIVATE HEALTH INSURANCE | Admitting: Family Medicine

## 2015-01-14 ENCOUNTER — Other Ambulatory Visit: Payer: Self-pay | Admitting: Family Medicine

## 2015-01-15 ENCOUNTER — Other Ambulatory Visit: Payer: Self-pay | Admitting: *Deleted

## 2015-01-15 MED ORDER — SIMVASTATIN 20 MG PO TABS: 20.0000 mg | ORAL_TABLET | Freq: Every day | ORAL | Status: DC

## 2015-01-23 ENCOUNTER — Encounter: Payer: Self-pay | Admitting: Family Medicine

## 2015-01-23 ENCOUNTER — Ambulatory Visit (INDEPENDENT_AMBULATORY_CARE_PROVIDER_SITE_OTHER): Payer: PRIVATE HEALTH INSURANCE | Admitting: Family Medicine

## 2015-01-23 VITALS — BP 138/84 | HR 86 | Temp 98.9°F | Wt 233.2 lb

## 2015-01-23 DIAGNOSIS — E119 Type 2 diabetes mellitus without complications: Secondary | ICD-10-CM

## 2015-01-23 DIAGNOSIS — R413 Other amnesia: Secondary | ICD-10-CM

## 2015-01-23 DIAGNOSIS — G894 Chronic pain syndrome: Secondary | ICD-10-CM

## 2015-01-23 DIAGNOSIS — E538 Deficiency of other specified B group vitamins: Secondary | ICD-10-CM

## 2015-01-23 DIAGNOSIS — Z23 Encounter for immunization: Secondary | ICD-10-CM

## 2015-01-23 MED ORDER — METHOCARBAMOL 500 MG PO TABS: ORAL_TABLET | ORAL | Status: DC

## 2015-01-23 MED ORDER — FENTANYL 75 MCG/HR TD PT72: 75.0000 ug | MEDICATED_PATCH | TRANSDERMAL | Status: DC

## 2015-01-23 MED ORDER — HYDROCODONE-ACETAMINOPHEN 7.5-325 MG PO TABS: 1.0000 | ORAL_TABLET | Freq: Three times a day (TID) | ORAL | Status: DC | PRN

## 2015-01-23 NOTE — Patient Instructions (Signed)
Go to the lab on the way out.  We'll contact you with your lab report. I'll check with Dr. Vickey Hugerohmeier in the meantime.  We'll be in touch after that.  Don't change your meds for now.  Take care.

## 2015-01-23 NOTE — Progress Notes (Signed)
Pre visit review using our clinic review tool, if applicable. No additional management support is needed unless otherwise documented below in the visit note.  Diabetes:  No meds Hypoglycemic episodes:no Hyperglycemic episodes:no Feet problems:no Blood Sugars averaging: usually controlled on episodic home checks.  Due for labs, pending.   H/o OSA, prev seen by Dr. Vickey Hugerohmeier.  Using CPAP and doing well with that.  Usually not snoring.   Still with sig groin pain and back pain.  Can get sciatic pain radiating down the legs.  Overall he has had some improvement historically with his pain meds- the pain is "manageable" with the prev addition of fent patch and the gabapentin.  Clearly his functional status is related to his pain control, he has some days where he is homebound from pain.  Still using tens unit on L lower back.  No ADE on meds, ie no constipation.  Pain is clearly worse with weather changes.  Compliant with meds.  He has seen pain clinic years ago, not recently.   Memory changes.  No recent changes per patient.  Had seen neuro, was noted about the concern for cognitive effects from pain meds.  There was a note about neuropsychological testing to be done, but I don't have records on that, we'll be in touch with neuro.  D/w pt.    PMH and SH reviewed  Meds, vitals, and allergies reviewed.   ROS: See HPI.  Otherwise negative.    GEN: nad, alert and oriented, leaning over the counter, in pain, walks with cane and limp.   HEENT: mucous membranes moist NECK: supple w/o LA CV: rrr. PULM: ctab, no inc wob ABD: soft, +bs EXT: no edema SKIN: no acute rash Lower back ttp, esp along L lower back w/o rash or bruising.

## 2015-01-24 ENCOUNTER — Encounter: Payer: Self-pay | Admitting: Family Medicine

## 2015-01-24 ENCOUNTER — Telehealth: Payer: Self-pay | Admitting: Family Medicine

## 2015-01-24 ENCOUNTER — Telehealth: Payer: Self-pay | Admitting: *Deleted

## 2015-01-24 LAB — LIPID PANEL
CHOLESTEROL: 201 mg/dL — AB (ref 0–200)
HDL: 40.7 mg/dL (ref >39.00)
NONHDL: 160.3
Total CHOL/HDL Ratio: 5
Triglycerides: 319 mg/dL — ABNORMAL HIGH (ref 0.0–149.0)
VLDL: 63.8 mg/dL — ABNORMAL HIGH (ref 0.0–40.0)

## 2015-01-24 LAB — HEMOGLOBIN A1C: HEMOGLOBIN A1C: 7.3 % — AB (ref 4.6–6.5)

## 2015-01-24 LAB — VITAMIN B12: Vitamin B-12: 490 pg/mL (ref 211–911)

## 2015-01-24 LAB — LDL CHOLESTEROL, DIRECT: Direct LDL: 118 mg/dL

## 2015-01-24 NOTE — Telephone Encounter (Signed)
Please see if you can get Dr .Vickey Hugerohmeier on the phone, [facility].   The following is a reminder for me for the conversation.   Had seen neuro re: memory, was noted about the concern for cognitive effects from pain meds.  There was a note about neuropsychological testing to be done but I don't have records on that.  I didn't know if it had been done.  He had prev injections in his back w/o relief.  He has been using CPAP with good effect.

## 2015-01-24 NOTE — Telephone Encounter (Signed)
Talked with Dr. Vickey Hugerohmeier.  She was concerned re: dementia vs depression/pseudodementia.  Neuropsych testing is reasonable.  She thinks the patient may not have followed through with this prev rec.  She is going to check to see if it was done and she'll notify us if we need to help get the patient set up.  It would reasonable to proceed with this in the meantime, if not already done.  We discussed his pain control situation and that I am treating him in good faith based on his reported symptoms.  He has no known diversion or sedation on meds.  I will await update from neuro re: neuropsych testing. I thanked her for taking the call.

## 2015-01-24 NOTE — Telephone Encounter (Signed)
Called about mutual pt.   Would like call back re: pt.

## 2015-01-24 NOTE — Assessment & Plan Note (Signed)
Still with sig groin pain and back pain.  Can get sciatic pain radiating down the legs.  Overall he has had some improvement historically with his pain meds- the pain is "manageable" with the prev addition of fent patch and the gabapentin.  Clearly his functional status is related to his pain control, he has some days where he is homebound from pain.  Still using tens unit on L lower back.  No ADE on meds, ie no constipation.  Pain is clearly worse with weather changes.  Compliant with meds.  He has seen pain clinic years ago, not recently.  I will d/w pt's case with neuro and then we'll be in contact with patient.

## 2015-01-24 NOTE — Telephone Encounter (Signed)
Spoke with Dr. Oliva Bustardohmeier's nurse and she took our number to return call.

## 2015-01-24 NOTE — Assessment & Plan Note (Signed)
See notes on labs.  No meds at this point.

## 2015-01-24 NOTE — Assessment & Plan Note (Signed)
No recent changes per patient.  Had seen neuro, was noted about the concern for cognitive effects from pain meds.  There was a note about neuropsychological testing to be done, but I don't have records on that, we'll be in touch with neuro.  D/w pt.  No change in meds at this point.

## 2015-01-25 NOTE — Telephone Encounter (Signed)
As discussed yesterday, patient needs referral to neuropsychologist to test for memory issues related to depression, to medication, to organic brain syndrome. Dr Cheree DittoGraham aware that we referred, but no results were in EPIC. Lease check and make sure he is scheduled.  CD

## 2015-01-28 ENCOUNTER — Other Ambulatory Visit: Payer: Self-pay | Admitting: *Deleted

## 2015-01-28 DIAGNOSIS — Z9989 Dependence on other enabling machines and devices: Principal | ICD-10-CM

## 2015-01-28 DIAGNOSIS — R413 Other amnesia: Secondary | ICD-10-CM

## 2015-01-28 DIAGNOSIS — G4733 Obstructive sleep apnea (adult) (pediatric): Secondary | ICD-10-CM

## 2015-01-28 NOTE — Progress Notes (Signed)
Order placed as per noted in 11-27-14 note.  I called pt and relayed that Dr. Leonides CaveZelson, neuropsychologist will be calling once authorization of insurance done to make appt for consult.    I placed copy of EEG report in mail for him, per his request. (normal - EEG).

## 2015-01-28 NOTE — Telephone Encounter (Signed)
I placed order for referral to neuropsych in EPIC.  (pt last seen 08/2014).

## 2015-01-29 ENCOUNTER — Encounter: Payer: Self-pay | Admitting: *Deleted

## 2015-01-30 NOTE — Telephone Encounter (Signed)
I called and LMVM for pt at home # to return call re: referral made to neuropsychology.

## 2015-01-30 NOTE — Telephone Encounter (Signed)
Noted, thanks. Will await report.

## 2015-01-31 NOTE — Progress Notes (Signed)
Noted, thanks!

## 2015-02-27 ENCOUNTER — Other Ambulatory Visit: Payer: Self-pay

## 2015-02-27 MED ORDER — HYDROCODONE-ACETAMINOPHEN 7.5-325 MG PO TABS: 1.0000 | ORAL_TABLET | Freq: Three times a day (TID) | ORAL | Status: DC | PRN

## 2015-02-27 MED ORDER — METHOCARBAMOL 500 MG PO TABS: ORAL_TABLET | ORAL | Status: DC

## 2015-02-27 MED ORDER — FENTANYL 75 MCG/HR TD PT72: 75.0000 ug | MEDICATED_PATCH | TRANSDERMAL | Status: DC

## 2015-02-27 NOTE — Telephone Encounter (Signed)
Pt request rx of hydrocodone apap,fentanyl and methocarbamol.call when ready for pick up; pt request to pick up today. Pt last seen 01/23/2015.

## 2015-02-27 NOTE — Telephone Encounter (Signed)
Printed.  Thanks.  

## 2015-02-27 NOTE — Telephone Encounter (Signed)
Left message informing patient his fentanyl, norco and robaxin rx is ready for pick up.

## 2015-03-25 ENCOUNTER — Other Ambulatory Visit: Payer: Self-pay | Admitting: *Deleted

## 2015-03-25 MED ORDER — METHOCARBAMOL 500 MG PO TABS: ORAL_TABLET | ORAL | Status: DC

## 2015-03-25 MED ORDER — HYDROCODONE-ACETAMINOPHEN 7.5-325 MG PO TABS: 1.0000 | ORAL_TABLET | Freq: Three times a day (TID) | ORAL | Status: DC | PRN

## 2015-03-25 MED ORDER — FENTANYL 75 MCG/HR TD PT72: 75.0000 ug | MEDICATED_PATCH | TRANSDERMAL | Status: DC

## 2015-03-25 NOTE — Telephone Encounter (Signed)
Patient advised.  Rx left at front desk for pick up. 

## 2015-03-25 NOTE — Telephone Encounter (Signed)
Patient left a voicemail requesting refills on his fentanyl patch, hydrocodone and robaxin. Last refills 02/27/15. Call when ready for pickup.

## 2015-03-25 NOTE — Telephone Encounter (Signed)
Printed.  Thanks.  

## 2015-04-09 ENCOUNTER — Ambulatory Visit: Payer: PRIVATE HEALTH INSURANCE | Admitting: Psychology

## 2015-04-14 ENCOUNTER — Telehealth: Payer: Self-pay | Admitting: Family Medicine

## 2015-04-14 DIAGNOSIS — R413 Other amnesia: Secondary | ICD-10-CM

## 2015-04-14 NOTE — Telephone Encounter (Signed)
Note from Dr. Leonides CaveZelson.  Patient canceled his appointment for neuropsychological evaluation locally. He stated that he wishes to see someone within the [facility] as his insurance network is [facility]. They have a neuropsychology department there. Their phone number is [phone].   I put in the referral.  Thanks.

## 2015-04-16 NOTE — Telephone Encounter (Signed)
[facility] Dr. Perfecto KingdomSollman 05/16/15 @ 1pm//arrive at 1245  Pt aware of appt day/time and Location

## 2015-04-19 ENCOUNTER — Other Ambulatory Visit: Payer: Self-pay | Admitting: *Deleted

## 2015-04-19 NOTE — Telephone Encounter (Signed)
Patient requests refills on Fentanyl patches, Robaxin, and Vicodin.  OV 01/23/15.  Last filled 03/25/15.  He is not completely out but will be at the start of the week.  Okay to refill?

## 2015-04-21 MED ORDER — FENTANYL 75 MCG/HR TD PT72: 75.0000 ug | MEDICATED_PATCH | TRANSDERMAL | Status: DC

## 2015-04-21 MED ORDER — HYDROCODONE-ACETAMINOPHEN 7.5-325 MG PO TABS: 1.0000 | ORAL_TABLET | Freq: Three times a day (TID) | ORAL | Status: DC | PRN

## 2015-04-21 MED ORDER — METHOCARBAMOL 500 MG PO TABS: ORAL_TABLET | ORAL | Status: DC

## 2015-04-21 NOTE — Telephone Encounter (Signed)
Printed.  Thanks.  

## 2015-04-22 NOTE — Telephone Encounter (Signed)
Patient advised.  Rx left at front desk for pick up. 

## 2015-04-30 ENCOUNTER — Ambulatory Visit: Payer: PRIVATE HEALTH INSURANCE | Admitting: Family Medicine

## 2015-05-17 ENCOUNTER — Other Ambulatory Visit: Payer: Self-pay | Admitting: Family Medicine

## 2015-05-17 MED ORDER — FENTANYL 75 MCG/HR TD PT72: 75.0000 ug | MEDICATED_PATCH | TRANSDERMAL | Status: DC

## 2015-05-17 MED ORDER — HYDROCODONE-ACETAMINOPHEN 7.5-325 MG PO TABS: 1.0000 | ORAL_TABLET | Freq: Three times a day (TID) | ORAL | Status: DC | PRN

## 2015-05-17 MED ORDER — METHOCARBAMOL 500 MG PO TABS: ORAL_TABLET | ORAL | Status: DC

## 2015-05-17 NOTE — Telephone Encounter (Signed)
Pt left v/m requesting refills of donepezil (last filled #90 x 1 on 11/12/14) and gabapentin (last filled 11/13/14 #360 x 5 refills) to [facility] [place] and request refill today, 05/17/15; pt also request refill lisinopril; done # 90 X 1. Pt last seen 01/23/2015.  Pt also request rx for fentanyl patch,hydrocodone apap and methoocarbamol; these 3 rx last printed on 04/21/2015. Call when ready for pick up.

## 2015-05-17 NOTE — Telephone Encounter (Signed)
Gabapentin and donepezil sent.  Others printed.   Did he have his neuropsych eval done?  He was to have that appointment at [facility] with Dr. Perfecto KingdomSollman on 05/16/15. I need to know about that visit.   Thanks.

## 2015-05-21 NOTE — Telephone Encounter (Signed)
Noted, thanks. I sent myself a reminder about that incoming note.

## 2015-05-21 NOTE — Telephone Encounter (Signed)
Patient advised. Rx left at front desk for pick up. Patient did see [facility] Perfect[facility]oll[facility] and signed a form for you to receive his report from her. Hopefully, it will arrive soon and if not, I will call for the report if you wish.

## 2015-05-22 ENCOUNTER — Telehealth: Payer: Self-pay

## 2015-05-22 NOTE — Telephone Encounter (Signed)
Patient is already being seen at [facility] at [facility].

## 2015-05-24 ENCOUNTER — Ambulatory Visit (INDEPENDENT_AMBULATORY_CARE_PROVIDER_SITE_OTHER): Payer: PRIVATE HEALTH INSURANCE | Admitting: Family Medicine

## 2015-05-24 ENCOUNTER — Encounter: Payer: Self-pay | Admitting: Family Medicine

## 2015-05-24 VITALS — BP 126/80 | HR 79 | Temp 98.7°F | Wt 235.0 lb

## 2015-05-24 DIAGNOSIS — E119 Type 2 diabetes mellitus without complications: Secondary | ICD-10-CM | POA: Diagnosis not present

## 2015-05-24 DIAGNOSIS — G894 Chronic pain syndrome: Secondary | ICD-10-CM

## 2015-05-24 LAB — COMPREHENSIVE METABOLIC PANEL
ALT: 27 U/L (ref 0–53)
AST: 22 U/L (ref 0–37)
Albumin: 4.7 g/dL (ref 3.5–5.2)
Alkaline Phosphatase: 90 U/L (ref 39–117)
BUN: 16 mg/dL (ref 6–23)
CHLORIDE: 100 meq/L (ref 96–112)
CO2: 30 meq/L (ref 19–32)
CREATININE: 1 mg/dL (ref 0.40–1.50)
Calcium: 9.7 mg/dL (ref 8.4–10.5)
GFR: 83.78 mL/min (ref >60.00)
GLUCOSE: 165 mg/dL — AB (ref 70–99)
Potassium: 4.4 mEq/L (ref 3.5–5.1)
SODIUM: 136 meq/L (ref 135–145)
Total Bilirubin: 0.4 mg/dL (ref 0.2–1.2)
Total Protein: 7.8 g/dL (ref 6.0–8.3)

## 2015-05-24 LAB — HEMOGLOBIN A1C: Hgb A1c MFr Bld: 7.3 % — ABNORMAL HIGH (ref 4.6–6.5)

## 2015-05-24 MED ORDER — DULOXETINE HCL 30 MG PO CPEP: 30.0000 mg | ORAL_CAPSULE | Freq: Every day | ORAL | Status: DC

## 2015-05-24 NOTE — Progress Notes (Signed)
Pre visit review using our clinic review tool, if applicable. No additional management support is needed unless otherwise documented below in the visit note.  DM2 f/u.  Due for labs.  See notes on labs.  No meds for sugar currently.  On ACE.  Not checking sugar at home.    Chronic pain.  No ADE on meds.  Had neurocog f/u and notes reviewed.  Likely with sig depression, no SI/HI.  D/w pt about treatment options.  Still with days not getting out of bed- likely partly due to pain and likely partly due to mood.  Wife noted more irritability.   We talked about dual treatment for pain and mood, ie cymbalta.  We talked about typical onset of effect from med and routine cautions.  His wife thinks he is depressed, the patient isn't sure about that dx but he does note irritability and agrees that if the cymbalta can help the pain, he wants to try it.  He continues to have some days that are worse than others, as far as pain is concerned.  Normal BMs, not sedated.  We talked about trying to taper down on the hydrocodone as tolerated when getting some relief from cymbalta, with plan to then try to taper down on fent patch.    Meds, vitals, and allergies reviewed.   ROS: See HPI.  Otherwise, noncontributory.  nad but uncomfortable Mmm Neck supple No LA rrr ctab abd soft Ext w/o edema.  Hunched over from back pain, using a can to walk, frequently repositions for comfort during the exam.   Diabetic foot exam: Normal inspection No skin breakdown No calluses  Normal DP pulses Normal sensation to light touch and monofilament Nails normal

## 2015-05-24 NOTE — Patient Instructions (Signed)
Start cymbalta 30mg  a day for now.  The goal is to gradually wean down on the hydrocodone.  Update me in a few weeks.   Go to the lab on the way out.  We'll contact you with your lab report. Take care.  Glad to see you.

## 2015-05-25 NOTE — Assessment & Plan Note (Signed)
With mood changes noted above, add on cymbalta 30mg  a day with routine cautions.  He agrees.  No SI/HI.  We talked about trying to taper down on the hydrocodone as tolerated when getting some relief from cymbalta, with plan to then try to taper down on fent patch.   He agrees.   Still okay for outpatient f/u.  He'll update me as we go along. >25 minutes spent in face to face time with patient, >50% spent in counselling or coordination of care.

## 2015-05-25 NOTE — Assessment & Plan Note (Signed)
See notes on labs. 

## 2015-06-10 ENCOUNTER — Other Ambulatory Visit: Payer: Self-pay | Admitting: Family Medicine

## 2015-06-10 NOTE — Telephone Encounter (Signed)
Electronic refill request. Last Filled:    10 tablet 12 RF on   01/31/14  Please advise.

## 2015-06-11 NOTE — Telephone Encounter (Signed)
Sent. Thanks.   

## 2015-06-12 ENCOUNTER — Telehealth: Payer: Self-pay | Admitting: Family Medicine

## 2015-06-12 NOTE — Telephone Encounter (Signed)
Please get update on patient on cymbalta, re: pain, mood, tolerance of med.  Thanks.

## 2015-06-12 NOTE — Telephone Encounter (Signed)
Left message on voice mail  to call back

## 2015-06-13 NOTE — Telephone Encounter (Signed)
Patient says he does not see any difference in his pain or mood.  Patient says his wife hasn't mentioned anything different about his mood but he will ask her tonight and let us know.

## 2015-06-13 NOTE — Telephone Encounter (Signed)
I'll await that update.  Thanks.

## 2015-06-18 ENCOUNTER — Other Ambulatory Visit: Payer: Self-pay

## 2015-06-18 NOTE — Telephone Encounter (Signed)
Pt left v/m requesting rx for fentanyl patch(last printed # 15 on 05/17/15), methocarbamol (last printed # 150 on 05/17/15) and hydrocodone apap (last printed # 90 on 05/17/15. Pt last seen 05/24/2015.

## 2015-06-19 MED ORDER — HYDROCODONE-ACETAMINOPHEN 7.5-325 MG PO TABS: 1.0000 | ORAL_TABLET | Freq: Three times a day (TID) | ORAL | Status: DC | PRN

## 2015-06-19 MED ORDER — METHOCARBAMOL 500 MG PO TABS: ORAL_TABLET | ORAL | Status: DC

## 2015-06-19 MED ORDER — FENTANYL 75 MCG/HR TD PT72: 75.0000 ug | MEDICATED_PATCH | TRANSDERMAL | Status: DC

## 2015-06-19 NOTE — Telephone Encounter (Signed)
Pt request cb  ASAP about status of refill request.

## 2015-06-19 NOTE — Telephone Encounter (Signed)
Printed.  Thanks.  

## 2015-06-19 NOTE — Telephone Encounter (Signed)
Patient notified by telephone that scripts are up front ready for pickup. 

## 2015-07-02 NOTE — Telephone Encounter (Signed)
Left detailed message on voicemail.  

## 2015-07-02 NOTE — Telephone Encounter (Signed)
I need an update on patient.  Please call him.  Thanks.

## 2015-07-04 MED ORDER — DULOXETINE HCL 60 MG PO CPEP: 60.0000 mg | ORAL_CAPSULE | Freq: Every day | ORAL | Status: DC

## 2015-07-04 NOTE — Telephone Encounter (Signed)
Would up the cymbalta to  a day, ie 2 tabs of the  at the same time.  Update me in about 2 weeks, sooner if needed.  Thanks.  Med list updated.   If new rx needed, then please send 30 day supply with 2 rf.   Thanks.

## 2015-07-04 NOTE — Telephone Encounter (Signed)
Pt left v/m returning call; pt said he and his wife cannot see any difference in his mood; pt taking cymbalta 30 mg taking one capsule in evening.Please advise.

## 2015-07-04 NOTE — Telephone Encounter (Signed)
Left detailed message on voicemail.  

## 2015-07-14 ENCOUNTER — Encounter: Payer: Self-pay | Admitting: Family Medicine

## 2015-07-14 DIAGNOSIS — R918 Other nonspecific abnormal finding of lung field: Secondary | ICD-10-CM | POA: Insufficient documentation

## 2015-07-14 DIAGNOSIS — N200 Calculus of kidney: Secondary | ICD-10-CM

## 2015-07-14 NOTE — Progress Notes (Signed)
Please call pt.  Saw that he was over at ER at [facility] with planned uro procedure.   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 If the patient has a history of smoking or other risk factor to increase their risk of malignancy, then a follow-up chest CT in 6 months is recommended. If the patient has no such risk factors, then a follow-up chest CT in 12 months is recommended. Per EMR, he used to smoke.  If so, will need f/u CT in about 6 months.  Please verify and update me so I can make plans about this.  Thanks.

## 2015-07-15 ENCOUNTER — Other Ambulatory Visit: Payer: Self-pay

## 2015-07-15 MED ORDER — HYDROCODONE-ACETAMINOPHEN 7.5-325 MG PO TABS: 1.0000 | ORAL_TABLET | Freq: Three times a day (TID) | ORAL | Status: DC | PRN

## 2015-07-15 MED ORDER — METHOCARBAMOL 500 MG PO TABS: ORAL_TABLET | ORAL | Status: DC

## 2015-07-15 MED ORDER — FENTANYL 75 MCG/HR TD PT72: 75.0000 ug | MEDICATED_PATCH | TRANSDERMAL | Status: DC
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
## 2015-07-15 NOTE — Telephone Encounter (Signed)
Pt left v/m requesting rx hydrocodone apap(Rx last printed #90 on 06/19/15), fentanyl patch(rx last printed # 15 on 06/19/15) and methocarbamol (rx last printed # 150 on 06/19/15). Call when ready for pick up. Pt last seen on 05/24/2015.

## 2015-07-15 NOTE — Progress Notes (Signed)
will need f/u CT in about 6 months.  I sent myself a reminder in the EMR.  Thanks.

## 2015-07-15 NOTE — Telephone Encounter (Signed)
Printed.  Thanks.  

## 2015-07-16 NOTE — Progress Notes (Signed)
Patient notified as instructed by telephone and verbalized understanding. Patient stated that he is really concerned about the results of the CT that was done. Patient stated that he had pneumonia years ago, has worked around automobiles and other factors in his life that makes him concerned. Patient requested that Dr. Para March call and discuss his concerns with him. Advised patient that Dr. Para March is with patient's all day and it would have to be probably in the evening before he can call him back.

## 2015-07-16 NOTE — Telephone Encounter (Signed)
Patient notified by telephone that scripts are up front ready for pickup. 

## 2015-07-17 ENCOUNTER — Telehealth: Payer: Self-pay | Admitting: Family Medicine

## 2015-07-17 NOTE — Telephone Encounter (Signed)
Pt called he was in to see dr Para March in June and wanted to know if he needed to come back in 3 months or 6 months i couldn't find any notes when pt needed to come back  in last office notes or labs  He went to [facility] ER 08/12/14 for kidney stone.  They did a CT and found nodules in left lower lung and wanted him to come back in 6 months for another CT

## 2015-07-18 NOTE — Progress Notes (Signed)
Called pt, LMOVM. Called wife, d/w her about pulmonary nodules and plan for recheck in 6 months.  All questions answered.  She'll relay info to him and they'll let me know if they have other concerns.

## 2015-07-18 NOTE — Telephone Encounter (Signed)
I'm going to get a phone follow up with at patient after his pending uro procedures.  We can make plans at that point.

## 2015-07-31 NOTE — Telephone Encounter (Signed)
Please get update on patient re: his mood and pain level.   Was to have lithotripsy at [facility] end of 06/2015.  Thanks.

## 2015-07-31 NOTE — Telephone Encounter (Signed)
Message left for patient to return call.

## 2015-08-01 NOTE — Telephone Encounter (Signed)
Patient says he really hasn't seen much difference but wife stated that it really hasn't been a good time to evaluate it because of his lithotripsy at the end of 06/2015.  Patient says he will call back in a couple of weeks to give a more accurate update.

## 2015-08-02 ENCOUNTER — Other Ambulatory Visit: Payer: Self-pay | Admitting: Family Medicine

## 2015-08-02 NOTE — Telephone Encounter (Signed)
Noted. Thanks.

## 2015-08-19 ENCOUNTER — Other Ambulatory Visit: Payer: Self-pay | Admitting: *Deleted

## 2015-08-19 DIAGNOSIS — M545 Low back pain: Secondary | ICD-10-CM

## 2015-08-19 NOTE — Telephone Encounter (Signed)
Patient left a voicemail that he needs refills on Hydrocodone, Fentanyl and Robaxin. Patient stated that he is getting low on these medication and request a call back when ready for pick up. Last refill on all 07/15/15

## 2015-08-20 MED ORDER — METHOCARBAMOL 500 MG PO TABS: ORAL_TABLET | ORAL | Status: DC

## 2015-08-20 MED ORDER — HYDROCODONE-ACETAMINOPHEN 7.5-325 MG PO TABS: 1.0000 | ORAL_TABLET | Freq: Three times a day (TID) | ORAL | Status: DC | PRN

## 2015-08-20 MED ORDER — FENTANYL 75 MCG/HR TD PT72: 75.0000 ug | MEDICATED_PATCH | TRANSDERMAL | Status: DC

## 2015-08-20 NOTE — Telephone Encounter (Signed)
When he picks up the rxs, please pass this message along.   We need to have him follow up with a pain clinic.  I've done what I know to do and he is still in sig discomfort.  I would like their input.   I put in the referral.

## 2015-08-20 NOTE — Telephone Encounter (Signed)
Patient advised.

## 2015-08-20 NOTE — Telephone Encounter (Signed)
Patient states his wife says his mood is perhaps a little better (ie more positive) but patient says his pain level is the same as always.  Patient advised that his Rx's will be ready for pickup as soon as Dr. Para March gets them signed.

## 2015-08-20 NOTE — Telephone Encounter (Signed)
All printed.  I need update on patient's pain level and mood with the cymbalta use.  Thanks.

## 2015-08-21 ENCOUNTER — Telehealth: Payer: Self-pay | Admitting: Family Medicine

## 2015-08-21 NOTE — Telephone Encounter (Signed)
Pt dropped off handicap placard renewal to be completed. Form in [employer] in box.

## 2015-08-21 NOTE — Telephone Encounter (Signed)
Patient notified by telephone that form is up front ready for pickup. 

## 2015-08-21 NOTE — Telephone Encounter (Signed)
Form done. Thanks. 

## 2015-08-21 NOTE — Telephone Encounter (Signed)
Form is on your desk.

## 2015-09-16 ENCOUNTER — Other Ambulatory Visit: Payer: Self-pay | Admitting: *Deleted

## 2015-09-16 NOTE — Telephone Encounter (Signed)
Patient left a voicemail that he needs refills on the following medications. Last refills on 08/20/15 Methocarbamol #150 Fentanyl patch #15 Hydrocodone #90  Call when ready for pickup   Patient also stated that his Cymbalta was increased to 2 a day which has helped and he is running out since he doubled up. Patient would like a new script with new directions sent to [facility].

## 2015-09-17 MED ORDER — HYDROCODONE-ACETAMINOPHEN 7.5-325 MG PO TABS: 1.0000 | ORAL_TABLET | Freq: Three times a day (TID) | ORAL | Status: DC | PRN

## 2015-09-17 MED ORDER — FENTANYL 75 MCG/HR TD PT72: 75.0000 ug | MEDICATED_PATCH | TRANSDERMAL | Status: DC

## 2015-09-17 MED ORDER — METHOCARBAMOL 500 MG PO TABS: ORAL_TABLET | ORAL | Status: DC

## 2015-09-17 MED ORDER — DULOXETINE HCL 60 MG PO CPEP: 60.0000 mg | ORAL_CAPSULE | Freq: Every day | ORAL | Status: DC

## 2015-09-17 NOTE — Telephone Encounter (Signed)
And needs f/u scheduled, before next set of rxs is due.   30 min OV. Thanks.

## 2015-09-17 NOTE — Telephone Encounter (Signed)
Rxs reprinted.  Initial rx set had the old fill on/after dates.  Old rx destroyed, witnessed by Dr. Reece Agar.

## 2015-09-17 NOTE — Telephone Encounter (Signed)
3 printed, cymbalta sent.  Thanks.

## 2015-09-17 NOTE — Telephone Encounter (Signed)
Patient notified that Rx was placed up front ready for pick up. 

## 2015-09-17 NOTE — Telephone Encounter (Signed)
Please schedule 30 min OV f/u as advised. Thanks!

## 2015-09-26 ENCOUNTER — Other Ambulatory Visit: Payer: Self-pay | Admitting: Family Medicine

## 2015-09-26 NOTE — Telephone Encounter (Signed)
Refilled on 05/17/15 #360 cap with 5 additional refills, please advise

## 2015-09-27 NOTE — Telephone Encounter (Signed)
Sent. Thanks.   

## 2015-09-30 ENCOUNTER — Telehealth: Payer: Self-pay | Admitting: Family Medicine

## 2015-09-30 NOTE — Telephone Encounter (Signed)
Pt needs updated prescription for CYmbalta as he is now taking 2 per day and his prescription is running out.  Pharmacy is [facility]  Best number to call pt is [phone]  DULoxetine (CYMBALTA) 60 MG capsule 30 capsule 2 09/17/2015     Sig - Route: Take 1 capsule (60 mg total) by mouth daily. - Oral

## 2015-10-01 MED ORDER — DULOXETINE HCL 60 MG PO CPEP: 60.0000 mg | ORAL_CAPSULE | Freq: Every day | ORAL | Status: DC

## 2015-10-01 NOTE — Telephone Encounter (Signed)
Sent. Thanks.   
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
## 2015-10-03 MED ORDER — DULOXETINE HCL 60 MG PO CPEP: 60.0000 mg | ORAL_CAPSULE | Freq: Two times a day (BID) | ORAL | Status: DC

## 2015-10-03 NOTE — Telephone Encounter (Signed)
Left voicemail requesting pt to call office back so we can schedule a 30 min f/u appt

## 2015-10-03 NOTE — Telephone Encounter (Signed)
Scheduled for 11/11 pt aware

## 2015-10-03 NOTE — Telephone Encounter (Signed)
I called and talked with pharmacy.  Patient has likely doubled up on the med, with the direction on the rx to be 60mg  a day.  At this point, I called in 60mg  tabs, 2 tabs a day, to continue as is for now.  #60, no rf.  Patient needs OV to discuss situation.   30min.  Thanks.

## 2015-10-04 ENCOUNTER — Other Ambulatory Visit: Payer: Self-pay | Admitting: Family Medicine

## 2015-10-21 ENCOUNTER — Other Ambulatory Visit: Payer: Self-pay

## 2015-10-21 NOTE — Telephone Encounter (Signed)
Pt left v/m requesting rx hydrocodone apap(last printed #90 on 09/17/15), fentanyl patch (last printed #15 on 09/17/15) and methocarbamol (last printed # 150 on 09/17/15.. Call when ready for pick up.pt last seen 05/24/15.

## 2015-10-22 MED ORDER — HYDROCODONE-ACETAMINOPHEN 7.5-325 MG PO TABS: 1.0000 | ORAL_TABLET | Freq: Three times a day (TID) | ORAL | Status: DC | PRN

## 2015-10-22 MED ORDER — FENTANYL 75 MCG/HR TD PT72: 75.0000 ug | MEDICATED_PATCH | TRANSDERMAL | Status: DC

## 2015-10-22 MED ORDER — METHOCARBAMOL 500 MG PO TABS: ORAL_TABLET | ORAL | Status: DC

## 2015-10-22 NOTE — Telephone Encounter (Signed)
Printed. Has f/u OV pending.

## 2015-10-22 NOTE — Telephone Encounter (Signed)
Left detailed message on voicemail. Rx left at front desk for pick up.  

## 2015-11-01 ENCOUNTER — Encounter: Payer: Self-pay | Admitting: Family Medicine

## 2015-11-01 ENCOUNTER — Ambulatory Visit (INDEPENDENT_AMBULATORY_CARE_PROVIDER_SITE_OTHER): Payer: PRIVATE HEALTH INSURANCE | Admitting: Family Medicine

## 2015-11-01 VITALS — BP 144/84 | HR 92 | Temp 98.5°F | Wt 234.5 lb

## 2015-11-01 DIAGNOSIS — R911 Solitary pulmonary nodule: Secondary | ICD-10-CM | POA: Diagnosis not present

## 2015-11-01 DIAGNOSIS — E119 Type 2 diabetes mellitus without complications: Secondary | ICD-10-CM | POA: Diagnosis not present

## 2015-11-01 DIAGNOSIS — Z23 Encounter for immunization: Secondary | ICD-10-CM

## 2015-11-01 DIAGNOSIS — M545 Low back pain: Secondary | ICD-10-CM | POA: Diagnosis not present

## 2015-11-01 DIAGNOSIS — R918 Other nonspecific abnormal finding of lung field: Secondary | ICD-10-CM

## 2015-11-01 DIAGNOSIS — G8929 Other chronic pain: Secondary | ICD-10-CM

## 2015-11-01 DIAGNOSIS — M549 Dorsalgia, unspecified: Secondary | ICD-10-CM

## 2015-11-01 LAB — HEMOGLOBIN A1C: Hgb A1c MFr Bld: 7.3 % — ABNORMAL HIGH (ref 4.6–6.5)

## 2015-11-01 MED ORDER — FENTANYL 50 MCG/HR TD PT72: 50.0000 ug | MEDICATED_PATCH | TRANSDERMAL | Status: DC

## 2015-11-01 NOTE — Patient Instructions (Addendum)
Call neuro about follow up for CPAP.   Patrick Ochoa will call about your referral.  Refer to PT, CT chest due at the end of the year.  Go to the lab on the way out.  We'll contact you with your lab report. We'll decrease your fentanyl patch with the next refill.  Take care.  Glad to see you.
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
## 2015-11-03 NOTE — Assessment & Plan Note (Signed)
See notes on labs. 
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
## 2015-11-03 NOTE — Assessment & Plan Note (Signed)
Ordered f/u CT chest for the end of the year.

## 2015-11-05 ENCOUNTER — Encounter: Payer: Self-pay | Admitting: *Deleted

## 2015-11-15 ENCOUNTER — Other Ambulatory Visit: Payer: Self-pay | Admitting: Family Medicine

## 2015-11-19 ENCOUNTER — Other Ambulatory Visit: Payer: Self-pay

## 2015-11-19 DIAGNOSIS — M25569 Pain in unspecified knee: Secondary | ICD-10-CM

## 2015-11-19 NOTE — Telephone Encounter (Signed)
Pt left v/m requesting rx hydrocodone apap(last printed # 90 on 10/22/15) and methocarbamol (last printed # 150 on 10/22/15 ). Call when ready for pick up. Pt as enough med for 1- 2 more days. Pt also request to go back to fentanyl 75 mg; with the weather changing pt requiring more pain med. Pt seen 11/01/15. Pt request cb.

## 2015-11-20 ENCOUNTER — Telehealth: Payer: Self-pay | Admitting: Family Medicine

## 2015-11-20 DIAGNOSIS — R918 Other nonspecific abnormal finding of lung field: Secondary | ICD-10-CM

## 2015-11-20 MED ORDER — HYDROCODONE-ACETAMINOPHEN 7.5-325 MG PO TABS: 1.0000 | ORAL_TABLET | Freq: Three times a day (TID) | ORAL | Status: DC | PRN

## 2015-11-20 MED ORDER — METHOCARBAMOL 500 MG PO TABS: ORAL_TABLET | ORAL | Status: DC

## 2015-11-20 MED ORDER — FENTANYL 50 MCG/HR TD PT72: 50.0000 ug | MEDICATED_PATCH | TRANSDERMAL | Status: DC

## 2015-11-20 NOTE — Telephone Encounter (Signed)
Pt called would like return call regarding CT scan that was done.  Also want to check on status of rx cb number is [phone] Thank you

## 2015-11-20 NOTE — Telephone Encounter (Signed)
Per EMR, I didn't write the patch yet.   The point at the last OV was to finish the 75s and then taper down to with the next set of refills (ie now). We should still taper down given the plan previously, with the startle reflex changes noted at .  I printed for , along with the other meds.   We should refer to pain clinic since he is still having trouble, as we discussed at the last OV.  I put in the referral.   Thanks.

## 2015-11-20 NOTE — Telephone Encounter (Signed)
Patient notified as instructed by telephone and verbalized understanding. Advised patient that the scripts are up front ready for pickup. Advised patient that one of the referral coordinators will be in touch to get the appointment set up with the pain clinic.

## 2015-11-21 NOTE — Telephone Encounter (Signed)
Patient advised.  Patient understands that it's good news that the nodule  is unchanged but questions what it could be?

## 2015-11-21 NOTE — Telephone Encounter (Signed)
Likely benign stable scar tissue.

## 2015-11-21 NOTE — Telephone Encounter (Signed)
rxs done.   Good news on CT.  Unchanged 4 mm left lower lobe pulmonary nodule.  A follow-up chest CT in another 12 months is recommended to assure stability.

## 2015-11-21 NOTE — Telephone Encounter (Signed)
Patient advised.

## 2015-12-05 LAB — HM DIABETES EYE EXAM

## 2015-12-13 ENCOUNTER — Other Ambulatory Visit: Payer: Self-pay | Admitting: Family Medicine

## 2015-12-13 MED ORDER — FENTANYL 50 MCG/HR TD PT72: 50.0000 ug | MEDICATED_PATCH | TRANSDERMAL | Status: DC

## 2015-12-13 NOTE — Telephone Encounter (Signed)
Pt left v/m; pt request cb 12/13/15 to pick up # 5 of fentanyl patch. Pt said in past pt got # 15 instead of # 10.please see 11/19/15 phone note. Pt states will be out of patches before Christmas and must have more patches. Please advise.

## 2015-12-13 NOTE — Telephone Encounter (Signed)
Called patient and notified him that Rx is ready for pick up. In front office.

## 2015-12-13 NOTE — Telephone Encounter (Signed)
Pt called stating the last rx he picked up for pain patches was for only 10 and this should of be 15.  He is completely out of these. He would like a call back.

## 2015-12-13 NOTE — Telephone Encounter (Signed)
Printed and in kims'box  

## 2015-12-16 NOTE — Telephone Encounter (Signed)
I apologize for shorting the last rx.  Thanks for correcting this with the needed 5 patches.

## 2015-12-17 NOTE — Telephone Encounter (Signed)
Left detailed message on voicemail.  

## 2015-12-19 ENCOUNTER — Telehealth: Payer: Self-pay

## 2015-12-19 NOTE — Telephone Encounter (Signed)
Pt left v/m requesting rx hydrocodone apap(last printed #90 on 11/20/15), fentanyl patch(last printed # 5 on 12/13/15) and methocarbamol(last printed # 150 on 11/20/15). Call when ready for pick up.last seen 11/01/15.

## 2015-12-20 MED ORDER — METHOCARBAMOL 500 MG PO TABS: ORAL_TABLET | ORAL | Status: DC

## 2015-12-20 MED ORDER — HYDROCODONE-ACETAMINOPHEN 7.5-325 MG PO TABS: 1.0000 | ORAL_TABLET | Freq: Three times a day (TID) | ORAL | Status: DC | PRN

## 2015-12-20 MED ORDER — FENTANYL 50 MCG/HR TD PT72: 50.0000 ug | MEDICATED_PATCH | TRANSDERMAL | Status: DC

## 2015-12-20 NOTE — Telephone Encounter (Signed)
Patient advised.

## 2015-12-20 NOTE — Telephone Encounter (Signed)
Printed. He had a more pronounced startle reflex on the higher dose of fentanyl.  Is that better on the lower dose? When is his appointment with Dr. Cherrie DistanceKapural?

## 2015-12-20 NOTE — Telephone Encounter (Signed)
1. No, has not noticed any jumping or twitching. Doesn't think it had any thing to do with dosage. 50 dosnt seem to be working as good as 75. Pt was hoping to go back to 75. But he is trying to follow insturctions 2. Already had consult with Dr. Cherrie DistanceKapural

## 2015-12-20 NOTE — Telephone Encounter (Signed)
Left detailed message on voicemail. Rx left at front desk for pick up.  

## 2015-12-20 NOTE — Telephone Encounter (Signed)
I would stay at the 50, I'll await the notes from Kapural.  Thanks.

## 2016-01-09 ENCOUNTER — Encounter: Payer: Self-pay | Admitting: Family Medicine

## 2016-01-21 ENCOUNTER — Other Ambulatory Visit: Payer: Self-pay

## 2016-01-21 NOTE — Telephone Encounter (Signed)
Pt left v/m requesting rxs for fentanyl patch(last printed # 15 on 12/20/15, hydrocodone apap(last printed # 90 on 12/20/15) and methocarbamol(last printed # 150 on 12/20/15). Pt was seen at pain clinic and received injection in back 2-3 weeks ago; still have sciatic nerve pain. Since fentanyl and hydrocodone was reduced in strength not getting as much effectiveness from meds. Pt request cb when ready for pick up.

## 2016-01-22 MED ORDER — METHOCARBAMOL 500 MG PO TABS: ORAL_TABLET | ORAL | Status: DC

## 2016-01-22 MED ORDER — FENTANYL 50 MCG/HR TD PT72: 50.0000 ug | MEDICATED_PATCH | TRANSDERMAL | Status: DC

## 2016-01-22 MED ORDER — HYDROCODONE-ACETAMINOPHEN 7.5-325 MG PO TABS: 1.0000 | ORAL_TABLET | Freq: Three times a day (TID) | ORAL | Status: DC | PRN

## 2016-01-22 NOTE — Telephone Encounter (Signed)
We shouldn't go back up on the pain meds given his prev situation, see prev notes.  And I need his notes from the pain clinic.  Thanks.

## 2016-01-22 NOTE — Telephone Encounter (Signed)
Patient notified as instructed by telephone and verbalized understanding. Patient advised that scripts are up front ready for pickup. Patient stated that the pain clinic was suppose to send the notes over and he will call them in the morning and give them the fax number to send them to.

## 2016-01-23 NOTE — Telephone Encounter (Signed)
Noted. Thanks.

## 2016-02-03 ENCOUNTER — Other Ambulatory Visit: Payer: Self-pay | Admitting: Family Medicine

## 2016-02-20 ENCOUNTER — Other Ambulatory Visit: Payer: Self-pay

## 2016-02-20 NOTE — Telephone Encounter (Signed)
Pt left v/m requesting fentanyl (last printed # 15 on 01/22/16),hydrocodone apap (last printed # 90 on 01/22/16) and methocarbamol(last printed # 150 on 01/22/16). Last seen 11/01/15. Pt is almost out of med and request to pick up on 02/21/16.

## 2016-02-21 MED ORDER — HYDROCODONE-ACETAMINOPHEN 7.5-325 MG PO TABS: 1.0000 | ORAL_TABLET | Freq: Three times a day (TID) | ORAL | Status: DC | PRN

## 2016-02-21 MED ORDER — METHOCARBAMOL 500 MG PO TABS: ORAL_TABLET | ORAL | Status: DC

## 2016-02-21 MED ORDER — FENTANYL 50 MCG/HR TD PT72: 50.0000 ug | MEDICATED_PATCH | TRANSDERMAL | Status: DC

## 2016-02-21 NOTE — Telephone Encounter (Signed)
Thanks

## 2016-02-21 NOTE — Telephone Encounter (Signed)
I need the notes, I didn't see them in the EMR.  He'll still need to find and f/u with a pain clinic.

## 2016-02-21 NOTE — Telephone Encounter (Signed)
Patient advised. Rx left at front desk for pick up.   Patient states he went to the pain clinic but didn't like the doctor and does not have a follow up appt scheduled.  Patient states they did some injections to his back but they didn't help.

## 2016-02-21 NOTE — Telephone Encounter (Signed)
Called and had to leave message and faxed for notes.

## 2016-02-21 NOTE — Telephone Encounter (Signed)
Printed. When is his f/u with the pain clinic?  Thanks.

## 2016-02-23 ENCOUNTER — Telehealth: Payer: Self-pay | Admitting: Family Medicine

## 2016-02-23 NOTE — Telephone Encounter (Signed)
I got the notes from the pain clinic.  Per their rec, the plan was to repeat back MRI, get L hip arthrogram, and wean fentanyl patch to then stop fentanyl patch.  This is a reasonable plan since he has been injected.  He needs to talk with them about the MRI and arthrogram if not already done.  My plan is to taper his fentanyl patch with the next rx.  Thanks.

## 2016-02-24 NOTE — Telephone Encounter (Signed)
Left detailed message on voicemail.  

## 2016-03-19 ENCOUNTER — Other Ambulatory Visit: Payer: Self-pay | Admitting: *Deleted

## 2016-03-19 MED ORDER — HYDROCODONE-ACETAMINOPHEN 7.5-325 MG PO TABS: 1.0000 | ORAL_TABLET | Freq: Three times a day (TID) | ORAL | Status: DC | PRN

## 2016-03-19 MED ORDER — METHOCARBAMOL 500 MG PO TABS: ORAL_TABLET | ORAL | Status: DC

## 2016-03-19 MED ORDER — FENTANYL 25 MCG/HR TD PT72: 25.0000 ug | MEDICATED_PATCH | TRANSDERMAL | Status: DC

## 2016-03-19 NOTE — Telephone Encounter (Signed)
Last f/u 10/2015 

## 2016-03-19 NOTE — Telephone Encounter (Signed)
rxs printed.  Per pain clinic rec, the plan was to repeat back MRI, get L hip arthrogram, and wean fentanyl patch to then stop fentanyl patch.  This is a reasonable plan since he has been injected.  He needs to talk with them about the MRI and arthrogram if not already done.  Note dose change on the fent patch with this rx.  Thanks.

## 2016-03-20 NOTE — Telephone Encounter (Signed)
Left detailed message on voicemail. Rx left at front desk for pick up.  

## 2016-04-14 ENCOUNTER — Other Ambulatory Visit: Payer: Self-pay

## 2016-04-14 NOTE — Telephone Encounter (Signed)
Pt left v/m requesting refill hydrocodone apap (last printed # 90 on 03/19/16) and methocarbamal (last printed # 150 on 03/19/16) and fentanyl patch 25 mg(last printed # 15 on 03/19/16) is not helping the pain.since the decrease in dosage from 75 mg to 50 mg to 25 mg, pt cannot tell pain relief with the fentanyl 25 mg dosage. Pt wants to know if could get fentanyl increased to 75 mg. Pt last seen 11/01/15. Pt request cb.

## 2016-04-15 MED ORDER — METHOCARBAMOL 500 MG PO TABS: ORAL_TABLET | ORAL | Status: DC

## 2016-04-15 MED ORDER — FENTANYL 25 MCG/HR TD PT72: 25.0000 ug | MEDICATED_PATCH | TRANSDERMAL | Status: DC

## 2016-04-15 MED ORDER — HYDROCODONE-ACETAMINOPHEN 7.5-325 MG PO TABS: 1.0000 | ORAL_TABLET | Freq: Three times a day (TID) | ORAL | Status: DC | PRN

## 2016-04-15 NOTE — Telephone Encounter (Signed)
Per pain clinic rec, the plan was to repeat back MRI, get L hip arthrogram, and wean fentanyl patch to then stop fentanyl patch.  This is a reasonable plan since he has been injected.  He needs to talk with them about the MRI and arthrogram if not already done.  I am willing to rx the patch for another month for him to get in contact with the pain clinic.  I haven't heard about any recent updates from the pain clinic.  rxs printed.  Thanks.

## 2016-04-15 NOTE — Telephone Encounter (Signed)
Left message on voicemail for patient to call back. 

## 2016-04-16 NOTE — Telephone Encounter (Signed)
Patient advised.

## 2016-05-21 ENCOUNTER — Other Ambulatory Visit: Payer: Self-pay

## 2016-05-21 NOTE — Telephone Encounter (Signed)
Pt is very discouraged; pt is on disability and does not have a lot of money to pay for MRI and other testing. Pt does not want to go back to the same pain doctor he has been seeing; pt said if needs to continue going to pain clinic pt request referral to different pain clinic. Pt has been basically miserable and in a lot of pain for last 2 months since decreasing the fentanyl patch to 25 mg. The injections in the back from the pain clinic has not helped pts pain. Pt request refill of hydrocodone apap (last printed # 90 on 04/15/16) and methocarbamol (last refilled # 150 on 04/15/16)the patient last seen 11/01/15. Pt request cb. Pt is out of med.

## 2016-05-22 MED ORDER — METHOCARBAMOL 500 MG PO TABS: ORAL_TABLET | ORAL | Status: DC

## 2016-05-22 MED ORDER — FENTANYL 25 MCG/HR TD PT72: 25.0000 ug | MEDICATED_PATCH | TRANSDERMAL | Status: DC

## 2016-05-22 MED ORDER — HYDROCODONE-ACETAMINOPHEN 7.5-325 MG PO TABS: 1.0000 | ORAL_TABLET | Freq: Three times a day (TID) | ORAL | Status: DC | PRN

## 2016-05-22 NOTE — Telephone Encounter (Signed)
meds printed, including the fentanyl for one more month.  The issue remains about this back pain.  He's likely still going to need the MRI, even if he changes pain clinics.  I would have him call the current pain clinic and see if they can put him on a payment plan to get the MRI started.  That would be useful.  Thanks.

## 2016-05-22 NOTE — Telephone Encounter (Signed)
Left detailed message on voicemail. Rx left at front desk for pick up.  

## 2016-06-01 ENCOUNTER — Other Ambulatory Visit: Payer: Self-pay | Admitting: Family Medicine

## 2016-06-01 NOTE — Telephone Encounter (Signed)
Patient called and said he will run out of his medication today.  Patient is requesting the medication be called in to the pharmacy today. Patient's wife works at [employer] and she gets off work at [employer].

## 2016-06-01 NOTE — Telephone Encounter (Signed)
Sent.  We need some notice on refills.  Thanks.

## 2016-06-19 ENCOUNTER — Other Ambulatory Visit: Payer: Self-pay

## 2016-06-19 NOTE — Telephone Encounter (Signed)
Pt left v/m requesting rx fentanyl (last printed # 15 on 05/22/16),hydrocodone apap (last printed # 90 on 05/22/16) and methocarbamol (last printed #150 on 05/22/16). Pt last seen 11/01/15.Please advise.

## 2016-06-21 MED ORDER — HYDROCODONE-ACETAMINOPHEN 7.5-325 MG PO TABS: 1.0000 | ORAL_TABLET | Freq: Three times a day (TID) | ORAL | Status: DC | PRN

## 2016-06-21 MED ORDER — METHOCARBAMOL 500 MG PO TABS: ORAL_TABLET | ORAL | Status: DC

## 2016-06-21 NOTE — Telephone Encounter (Signed)
Did he f/u with the pain clinic?  We've left messages about that, along with the taper of the fentanyl.  I didn't print the fentanyl but I did the other meds.  And he's due for fu re: DM2 and pain issues.  Thanks.

## 2016-06-22 NOTE — Telephone Encounter (Signed)
Left detailed message on voicemail. Rx left at front desk for pick up.  

## 2016-07-16 ENCOUNTER — Other Ambulatory Visit: Payer: Self-pay

## 2016-07-16 MED ORDER — METHOCARBAMOL 500 MG PO TABS: ORAL_TABLET | ORAL | 0 refills | Status: DC

## 2016-07-16 MED ORDER — HYDROCODONE-ACETAMINOPHEN 7.5-325 MG PO TABS: 1.0000 | ORAL_TABLET | Freq: Three times a day (TID) | ORAL | 0 refills | Status: DC | PRN

## 2016-07-16 NOTE — Telephone Encounter (Signed)
Left detailed message on voicemail.  Notation also made on Rx envelope to schedule 30 min OV for DM FU.  Rx left at front desk for pick up.

## 2016-07-16 NOTE — Telephone Encounter (Signed)
Printed.  He is due for f/u re: DM2.  OV would be useful.

## 2016-07-16 NOTE — Telephone Encounter (Signed)
Pt left v/m requesting rx for hydrocodone apap (last printed # 90  On 06/21/16) and methocarbamol (last printed # 150 on 06/21/16). Last f/u 11/01/15. Call when ready for pick up.

## 2016-07-27 ENCOUNTER — Other Ambulatory Visit: Payer: Self-pay | Admitting: Family Medicine

## 2016-07-27 NOTE — Telephone Encounter (Signed)
Electronic refill request. Last Filled:    9 tablet 12 06/11/2015

## 2016-07-27 NOTE — Telephone Encounter (Signed)
Sent. Thanks.   

## 2016-08-14 ENCOUNTER — Encounter: Payer: Self-pay | Admitting: Family Medicine

## 2016-08-14 ENCOUNTER — Ambulatory Visit (INDEPENDENT_AMBULATORY_CARE_PROVIDER_SITE_OTHER): Payer: PRIVATE HEALTH INSURANCE | Admitting: Family Medicine

## 2016-08-14 VITALS — BP 152/98 | HR 90 | Temp 98.6°F | Wt 218.5 lb

## 2016-08-14 DIAGNOSIS — E119 Type 2 diabetes mellitus without complications: Secondary | ICD-10-CM | POA: Diagnosis not present

## 2016-08-14 DIAGNOSIS — M549 Dorsalgia, unspecified: Secondary | ICD-10-CM

## 2016-08-14 DIAGNOSIS — Z119 Encounter for screening for infectious and parasitic diseases, unspecified: Secondary | ICD-10-CM | POA: Diagnosis not present

## 2016-08-14 DIAGNOSIS — Z23 Encounter for immunization: Secondary | ICD-10-CM

## 2016-08-14 DIAGNOSIS — G8929 Other chronic pain: Secondary | ICD-10-CM

## 2016-08-14 DIAGNOSIS — R918 Other nonspecific abnormal finding of lung field: Secondary | ICD-10-CM

## 2016-08-14 DIAGNOSIS — Z1159 Encounter for screening for other viral diseases: Secondary | ICD-10-CM

## 2016-08-14 LAB — COMPREHENSIVE METABOLIC PANEL
ALT: 20 U/L (ref 0–53)
AST: 16 U/L (ref 0–37)
Albumin: 5.1 g/dL (ref 3.5–5.2)
Alkaline Phosphatase: 57 U/L (ref 39–117)
BUN: 19 mg/dL (ref 6–23)
CO2: 28 meq/L (ref 19–32)
Calcium: 9.8 mg/dL (ref 8.4–10.5)
Chloride: 100 mEq/L (ref 96–112)
Creatinine, Ser: 1.03 mg/dL (ref 0.40–1.50)
GFR: 80.58 mL/min (ref >60.00)
GLUCOSE: 183 mg/dL — AB (ref 70–99)
POTASSIUM: 4.9 meq/L (ref 3.5–5.1)
SODIUM: 136 meq/L (ref 135–145)
TOTAL PROTEIN: 8.4 g/dL — AB (ref 6.0–8.3)
Total Bilirubin: 0.4 mg/dL (ref 0.2–1.2)

## 2016-08-14 LAB — LIPID PANEL
Cholesterol: 323 mg/dL — ABNORMAL HIGH (ref 0–200)
HDL: 53.6 mg/dL (ref >39.00)
NONHDL: 269.46
TRIGLYCERIDES: 255 mg/dL — AB (ref 0.0–149.0)
Total CHOL/HDL Ratio: 6
VLDL: 51 mg/dL — AB (ref 0.0–40.0)

## 2016-08-14 LAB — LDL CHOLESTEROL, DIRECT: Direct LDL: 221 mg/dL

## 2016-08-14 LAB — HEMOGLOBIN A1C: Hgb A1c MFr Bld: 7.7 % — ABNORMAL HIGH (ref 4.6–6.5)

## 2016-08-14 MED ORDER — LISINOPRIL 10 MG PO TABS: 10.0000 mg | ORAL_TABLET | Freq: Every day | ORAL | 1 refills | Status: DC

## 2016-08-14 MED ORDER — MONTELUKAST SODIUM 10 MG PO TABS
10.0000 mg | ORAL_TABLET | Freq: Every day | ORAL | 1 refills | Status: DC
Start: 2016-08-14 — End: 2017-03-26

## 2016-08-14 MED ORDER — METHOCARBAMOL 500 MG PO TABS: ORAL_TABLET | ORAL | 0 refills | Status: DC

## 2016-08-14 MED ORDER — SIMVASTATIN 20 MG PO TABS: 20.0000 mg | ORAL_TABLET | Freq: Every day | ORAL | 1 refills | Status: DC

## 2016-08-14 MED ORDER — HYDROCODONE-ACETAMINOPHEN 7.5-325 MG PO TABS: 1.0000 | ORAL_TABLET | Freq: Three times a day (TID) | ORAL | 0 refills | Status: AC | PRN

## 2016-08-14 MED ORDER — FLUTICASONE PROPIONATE 50 MCG/ACT NA SUSP: NASAL | 3 refills | Status: AC

## 2016-08-14 MED ORDER — DULOXETINE HCL 60 MG PO CPEP: 120.0000 mg | ORAL_CAPSULE | Freq: Every day | ORAL | 1 refills | Status: AC

## 2016-08-14 MED ORDER — DONEPEZIL HCL 10 MG PO TABS: 10.0000 mg | ORAL_TABLET | Freq: Every day | ORAL | 1 refills | Status: DC

## 2016-08-14 MED ORDER — ALBUTEROL SULFATE HFA 108 (90 BASE) MCG/ACT IN AERS: 2.0000 | INHALATION_SPRAY | Freq: Four times a day (QID) | RESPIRATORY_TRACT | 2 refills | Status: AC | PRN

## 2016-08-14 NOTE — Patient Instructions (Addendum)
I either need the most recent MRI done or I need to schedule a repeat MRI.  Let me know the location of the most recent MRI so we can try get the records.  Go to the lab on the way out.  We'll contact you with your lab report. We'll go from there.   Take care.  Glad to see you.
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
## 2016-08-14 NOTE — Progress Notes (Signed)
Pre visit review using our clinic review tool, if applicable. No additional management support is needed unless otherwise documented below in the visit note. 

## 2016-08-15 LAB — HEPATITIS C ANTIBODY: HCV Ab: NEGATIVE

## 2016-08-16 ENCOUNTER — Other Ambulatory Visit: Payer: Self-pay | Admitting: Family Medicine

## 2016-08-16 DIAGNOSIS — Z1159 Encounter for screening for other viral diseases: Secondary | ICD-10-CM | POA: Insufficient documentation

## 2016-08-16 DIAGNOSIS — E119 Type 2 diabetes mellitus without complications: Secondary | ICD-10-CM

## 2016-08-16 NOTE — Assessment & Plan Note (Signed)
See notes on labs. 

## 2016-08-16 NOTE — Assessment & Plan Note (Signed)
D/w pt. Unchanged 4 mm left lower lobe pulmonary nodule. He'll need a follow-up chest CT at 12 months.  Not due yet.

## 2016-08-16 NOTE — Assessment & Plan Note (Addendum)
See notes on labs.  >25 minutes spent in face to face time with patient, >50% spent in counselling or coordination of care.  

## 2016-08-16 NOTE — Assessment & Plan Note (Addendum)
He saw the pain clinic, was injected, but was dissatisfied and didn't want to go back.  Per patient he has had MRI done in the last year.  I don't see record of that being done, either in our system or at [facility] via care everywhere.  D/w pt.  He is going to get me an address where he had the MRI most recently done so I can review.  No SI/HI.  he has been weaned off fentanyl patch.  His [place] database refills are appropriate.  I filled his meds at this point, treating him in good faith, with the objective symmetric hyper reflexive response noted in the BLE.  No ADE on meds .

## 2016-10-26 ENCOUNTER — Other Ambulatory Visit: Payer: Self-pay | Admitting: Family Medicine

## 2016-10-27 NOTE — Telephone Encounter (Signed)
Electronic refill request. Last Filled:     9 tablet 3 07/27/2016  Last office visit:   08/14/16  Please advise.

## 2016-10-28 NOTE — Telephone Encounter (Signed)
Revonda StandardAllison at pharmacy notified by telephone as instructed and verbalized understanding. Dr. [name] removed as PCP on EPIC.

## 2016-10-28 NOTE — Telephone Encounter (Signed)
Looks like he has established with  Herber, Catalina Gravelichard Ray, MD  [address]  [address]  [place], [place] [id number]  [phone]  [phone] (Fax)   Denied the rx.  Please remove me as the PCP.  Please notify pharmacy to direct refills to new MD.  Please void out any refills from me on other meds that are still at the pharmacy.  Thanks.

## 2017-01-28 ENCOUNTER — Other Ambulatory Visit: Payer: Self-pay | Admitting: Family Medicine

## 2017-01-28 NOTE — Telephone Encounter (Signed)
Denied, not a patient here now.  Thanks.

## 2017-01-28 NOTE — Telephone Encounter (Signed)
Last OV 08/14/16. Has no scheduled follow up visit.  Looks like pt has been recently followed by [facility]ber [facility].  Okay to refill?

## 2017-03-26 ENCOUNTER — Other Ambulatory Visit: Payer: Self-pay | Admitting: Family Medicine

## 2017-04-21 ENCOUNTER — Other Ambulatory Visit: Payer: Self-pay | Admitting: Family Medicine

## 2017-05-21 ENCOUNTER — Other Ambulatory Visit: Payer: Self-pay | Admitting: Family Medicine

## 2017-11-09 ENCOUNTER — Other Ambulatory Visit: Payer: Self-pay | Admitting: Family Medicine

## 2017-11-09 NOTE — Telephone Encounter (Signed)
Electronic refill request.  Last office visit:   08/14/16  No upcoming appointments scheduled Last Filled:   Lisinopril 90 tablet 0 03/27/2017  Last Filled:   Simvastatin 90 tablet 0 03/27/2017  Please advise.  ? No longer PCP?

## 2017-11-10 NOTE — Telephone Encounter (Signed)
Denied.  Thanks.  [facility] longer [facility] at [facility].

## 2017-11-17 ENCOUNTER — Other Ambulatory Visit: Payer: Self-pay | Admitting: Family Medicine

## 2017-11-17 NOTE — Telephone Encounter (Signed)
Electronic refills No longer patient at [facility].

## 2017-11-17 NOTE — Telephone Encounter (Signed)
Declined.  Thanks.  

## 2017-12-02 ENCOUNTER — Other Ambulatory Visit: Payer: Self-pay | Admitting: Family Medicine

## 2017-12-09 ENCOUNTER — Other Ambulatory Visit: Payer: Self-pay | Admitting: Family Medicine

## 2017-12-09 NOTE — Telephone Encounter (Signed)
Patient not seen at this clinic now.   Denied rx.  Thanks.

## 2017-12-09 NOTE — Telephone Encounter (Signed)
Electronic refill request.  Last office visit:   08/14/2016  No upcoming appointments scheduled Last Filled:   Flonase 16 g 3 08/14/2016  Last Filled:   Montelukast 90 tablet 0 03/27/2017  Please advise.

## 2018-01-03 ENCOUNTER — Other Ambulatory Visit: Payer: Self-pay | Admitting: Family Medicine

## 2018-01-04 NOTE — Telephone Encounter (Signed)
We [facility] are not involved in his care now.  I denied the rxs.   Thanks.

## 2018-01-04 NOTE — Telephone Encounter (Signed)
Am I correct in thinking that you are not his PCP any longer?

## 2018-07-07 ENCOUNTER — Emergency Department (HOSPITAL_COMMUNITY): Payer: PRIVATE HEALTH INSURANCE

## 2018-07-07 ENCOUNTER — Emergency Department (HOSPITAL_COMMUNITY)
Admission: EM | Admit: 2018-07-07 | Discharge: 2018-07-07 | Disposition: A | Discharge status: Home / Self Care | Payer: PRIVATE HEALTH INSURANCE | Attending: Emergency Medicine | Admitting: Emergency Medicine

## 2018-07-07 ENCOUNTER — Other Ambulatory Visit: Payer: Self-pay

## 2018-07-07 DIAGNOSIS — Y908 Blood alcohol level of 240 mg/100 ml or more: Secondary | ICD-10-CM | POA: Insufficient documentation

## 2018-07-07 DIAGNOSIS — R55 Syncope and collapse: Secondary | ICD-10-CM | POA: Insufficient documentation

## 2018-07-07 DIAGNOSIS — E119 Type 2 diabetes mellitus without complications: Secondary | ICD-10-CM | POA: Insufficient documentation

## 2018-07-07 DIAGNOSIS — I10 Essential (primary) hypertension: Secondary | ICD-10-CM | POA: Insufficient documentation

## 2018-07-07 DIAGNOSIS — Y999 Unspecified external cause status: Secondary | ICD-10-CM | POA: Insufficient documentation

## 2018-07-07 DIAGNOSIS — R1030 Lower abdominal pain, unspecified: Secondary | ICD-10-CM | POA: Diagnosis not present

## 2018-07-07 DIAGNOSIS — Z79899 Other long term (current) drug therapy: Secondary | ICD-10-CM | POA: Diagnosis not present

## 2018-07-07 DIAGNOSIS — F1010 Alcohol abuse, uncomplicated: Secondary | ICD-10-CM

## 2018-07-07 DIAGNOSIS — M542 Cervicalgia: Secondary | ICD-10-CM | POA: Insufficient documentation

## 2018-07-07 DIAGNOSIS — R079 Chest pain, unspecified: Secondary | ICD-10-CM | POA: Insufficient documentation

## 2018-07-07 DIAGNOSIS — Z87891 Personal history of nicotine dependence: Secondary | ICD-10-CM | POA: Diagnosis not present

## 2018-07-07 DIAGNOSIS — Y92410 Unspecified street and highway as the place of occurrence of the external cause: Secondary | ICD-10-CM | POA: Diagnosis not present

## 2018-07-07 DIAGNOSIS — Z7984 Long term (current) use of oral hypoglycemic drugs: Secondary | ICD-10-CM | POA: Insufficient documentation

## 2018-07-07 LAB — I-STAT CHEM 8, ED
BUN: 10 mg/dL (ref 6–20)
CALCIUM ION: 1.05 mmol/L — AB (ref 1.15–1.40)
CHLORIDE: 99 mmol/L (ref 98–111)
Creatinine, Ser: 1.1 mg/dL (ref 0.61–1.24)
Glucose, Bld: 262 mg/dL — ABNORMAL HIGH (ref 70–99)
HCT: 45 % (ref 39.0–52.0)
HEMOGLOBIN: 15.3 g/dL (ref 13.0–17.0)
POTASSIUM: 4 mmol/L (ref 3.5–5.1)
SODIUM: 135 mmol/L (ref 135–145)
TCO2: 22 mmol/L (ref 22–32)

## 2018-07-07 LAB — COMPREHENSIVE METABOLIC PANEL
ALBUMIN: 4.2 g/dL (ref 3.5–5.0)
ALK PHOS: 65 U/L (ref 38–126)
ALT: 26 U/L (ref 0–44)
ANION GAP: 14 (ref 5–15)
AST: 30 U/L (ref 15–41)
BUN: 9 mg/dL (ref 6–20)
CALCIUM: 9.1 mg/dL (ref 8.9–10.3)
CHLORIDE: 99 mmol/L (ref 98–111)
CO2: 23 mmol/L (ref 22–32)
Creatinine, Ser: 0.93 mg/dL (ref 0.61–1.24)
GFR calc Af Amer: >60 mL/min (ref >60)
GFR calc non Af Amer: >60 mL/min (ref >60)
GLUCOSE: 268 mg/dL — AB (ref 70–99)
Potassium: 4 mmol/L (ref 3.5–5.1)
SODIUM: 136 mmol/L (ref 135–145)
Total Bilirubin: 0.5 mg/dL (ref 0.3–1.2)
Total Protein: 6.9 g/dL (ref 6.5–8.1)

## 2018-07-07 LAB — PROTIME-INR
INR: 0.98
PROTHROMBIN TIME: 12.9 s (ref 11.4–15.2)

## 2018-07-07 LAB — URINALYSIS, MICROSCOPIC (REFLEX)
Bacteria, UA: NONE SEEN
WBC UA: NONE SEEN WBC/hpf (ref 0–5)

## 2018-07-07 LAB — CBC
HEMATOCRIT: 44.9 % (ref 39.0–52.0)
Hemoglobin: 14.9 g/dL (ref 13.0–17.0)
MCH: 30.8 pg (ref 26.0–34.0)
MCHC: 33.2 g/dL (ref 30.0–36.0)
MCV: 93 fL (ref 78.0–100.0)
Platelets: 273 10*3/uL (ref 150–400)
RBC: 4.83 MIL/uL (ref 4.22–5.81)
RDW: 12 % (ref 11.5–15.5)
WBC: 11.4 10*3/uL — ABNORMAL HIGH (ref 4.0–10.5)

## 2018-07-07 LAB — URINALYSIS, ROUTINE W REFLEX MICROSCOPIC
Bilirubin Urine: NEGATIVE
LEUKOCYTES UA: NEGATIVE
Nitrite: NEGATIVE
PH: 5 (ref 5.0–8.0)
Protein, ur: NEGATIVE mg/dL
SPECIFIC GRAVITY, URINE: 1.01 (ref 1.005–1.030)

## 2018-07-07 LAB — SAMPLE TO BLOOD BANK

## 2018-07-07 LAB — I-STAT CG4 LACTIC ACID, ED: Lactic Acid, Venous: 3.63 mmol/L (ref 0.5–1.9)

## 2018-07-07 LAB — ETHANOL: ALCOHOL ETHYL (B): 263 mg/dL — AB (ref <10)

## 2018-07-07 MED ORDER — IOHEXOL 300 MG/ML  SOLN
100.0000 mL | Freq: Once | INTRAMUSCULAR | Status: AC | PRN
  Administered 2018-07-07: 100 mL via INTRAVENOUS

## 2018-07-07 MED ORDER — SODIUM CHLORIDE 0.9 % IV BOLUS
1000.0000 mL | Freq: Once | INTRAVENOUS | Status: AC
  Administered 2018-07-07: 1000 mL via INTRAVENOUS

## 2018-07-07 MED ORDER — METHOCARBAMOL 500 MG PO TABS: 500.0000 mg | ORAL_TABLET | Freq: Three times a day (TID) | ORAL | 0 refills | Status: AC | PRN
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
## 2018-07-07 NOTE — ED Notes (Signed)
Pt ambulated steadily with assistance, usually utilizes a walker at home. NAD. Pt stating inappropriate comments to staff on the way out. Family to transport home.
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
